# Patient Record
Sex: Female | Born: 1960 | Race: White | Hispanic: No | Smoking: Current every day smoker
Health system: Southern US, Community
[De-identification: ages and names within clinical notes are randomized; demographics above are authoritative.]

## PROBLEM LIST (undated history)

## (undated) DIAGNOSIS — R569 Unspecified convulsions: Secondary | ICD-10-CM

## (undated) DIAGNOSIS — I2510 Atherosclerotic heart disease of native coronary artery without angina pectoris: Secondary | ICD-10-CM

## (undated) DIAGNOSIS — J45909 Unspecified asthma, uncomplicated: Secondary | ICD-10-CM

## (undated) DIAGNOSIS — F329 Major depressive disorder, single episode, unspecified: Secondary | ICD-10-CM

## (undated) DIAGNOSIS — F419 Anxiety disorder, unspecified: Secondary | ICD-10-CM

## (undated) DIAGNOSIS — I639 Cerebral infarction, unspecified: Secondary | ICD-10-CM

## (undated) DIAGNOSIS — E119 Type 2 diabetes mellitus without complications: Secondary | ICD-10-CM

## (undated) DIAGNOSIS — F32A Depression, unspecified: Secondary | ICD-10-CM

## (undated) DIAGNOSIS — I10 Essential (primary) hypertension: Secondary | ICD-10-CM

---

## 2015-11-07 ENCOUNTER — Inpatient Hospital Stay (HOSPITAL_COMMUNITY)
Admission: AD | Admit: 2015-11-07 | Discharge: 2015-11-23 | DRG: 070 | Disposition: A | Discharge status: Nursing Home (Includes ICF) | Payer: Self-pay | Source: Other Acute Inpatient Hospital | Attending: Neurosurgery | Admitting: Neurosurgery

## 2015-11-07 DIAGNOSIS — G40909 Epilepsy, unspecified, not intractable, without status epilepticus: Secondary | ICD-10-CM | POA: Diagnosis present

## 2015-11-07 DIAGNOSIS — I2510 Atherosclerotic heart disease of native coronary artery without angina pectoris: Secondary | ICD-10-CM | POA: Diagnosis present

## 2015-11-07 DIAGNOSIS — J449 Chronic obstructive pulmonary disease, unspecified: Secondary | ICD-10-CM | POA: Diagnosis present

## 2015-11-07 DIAGNOSIS — F419 Anxiety disorder, unspecified: Secondary | ICD-10-CM | POA: Diagnosis present

## 2015-11-07 DIAGNOSIS — Z955 Presence of coronary angioplasty implant and graft: Secondary | ICD-10-CM

## 2015-11-07 DIAGNOSIS — E785 Hyperlipidemia, unspecified: Secondary | ICD-10-CM | POA: Diagnosis present

## 2015-11-07 DIAGNOSIS — E872 Acidosis: Secondary | ICD-10-CM | POA: Diagnosis present

## 2015-11-07 DIAGNOSIS — J441 Chronic obstructive pulmonary disease with (acute) exacerbation: Secondary | ICD-10-CM | POA: Insufficient documentation

## 2015-11-07 DIAGNOSIS — E876 Hypokalemia: Secondary | ICD-10-CM | POA: Diagnosis present

## 2015-11-07 DIAGNOSIS — I609 Nontraumatic subarachnoid hemorrhage, unspecified: Secondary | ICD-10-CM | POA: Diagnosis present

## 2015-11-07 DIAGNOSIS — I6783 Posterior reversible encephalopathy syndrome: Principal | ICD-10-CM | POA: Diagnosis present

## 2015-11-07 DIAGNOSIS — W57XXXA Bitten or stung by nonvenomous insect and other nonvenomous arthropods, initial encounter: Secondary | ICD-10-CM | POA: Diagnosis present

## 2015-11-07 DIAGNOSIS — I10 Essential (primary) hypertension: Secondary | ICD-10-CM | POA: Diagnosis present

## 2015-11-07 LAB — POCT I-STAT 3, ART BLOOD GAS (G3+)
ACID-BASE EXCESS: 7 mmol/L — AB (ref 0.0–2.0)
Bicarbonate: 31.1 mEq/L — ABNORMAL HIGH (ref 20.0–24.0)
O2 SAT: 97 %
PH ART: 7.486 — AB (ref 7.350–7.450)
PO2 ART: 86 mmHg (ref 80.0–100.0)
TCO2: 32 mmol/L (ref 0–100)
pCO2 arterial: 41.1 mmHg (ref 35.0–45.0)

## 2015-11-07 NOTE — H&P (Addendum)
Joan Newman is an 55 y.o. female.   Chief Complaint: unresponsiveness, seizure HPI: [name] was found unresponsive this morning by her mother. She had not answered the phone earlier in the day at 1000 and her mother then went to her home. [name] did not open the door when her mother arrived.The domicile was entered by law enforcement, and she was found awake, on the floor, and minimally responsive. She was brought to the ED at [facility] and noted to have 3 seizures while in the hospital. She was given ativan to control the seizures. Head ct showed a small amount of left frontal subarachnoid blood, not suggestive an aneurysmal hemorrhage but of PRES. She desaturated when she seized and placed on bipap, then weaned to a nasal cannula. Her family stated to medical personnel that she was withdrawing from benzodiazepines, though there was no stated reason she was taking benzodiazepines. EMS stated she had 3 days of nausea and emesis. Lactate found to be elevated at [facility], treated with a saline bolus. Initial exam in ED noted for no response, drooling post seizure  No past medical history on file.heart disease Seizure disorder Anxiety COPD Hyperlipidemia CAD   No past surgical history on file.  Cesarean section Coronary stent drug eluting placement  No family history on file. Social History:  has no tobacco, alcohol, and drug history on file.  Allergies: Allergies not on file  No prescriptions prior to admission    No results found for this or any previous visit (from the past 48 hour(s)). No results found.  Review of Systems  Unable to perform ROS: mental acuity    There were no vitals taken for this visit. Physical Exam  Constitutional: She appears well-developed and well-nourished. She appears lethargic.  HENT:  Head: Normocephalic and atraumatic.  Eyes: Conjunctivae are normal. Pupils are equal, round, and reactive to light.  Neck: Normal range of motion.   Neurological: She appears lethargic. She is disoriented.  Arouses to voice, and noxious stimuli. Not following commands. Unable to tell me her last name. Moving all extremities. Symmetric facial movements Motor exam not possible due to mental status. Localizes, purposeful. Cannot do detailed sensory exam Gait, coordination not assessed  Skin: Skin is warm and dry.     Assessment/Plan Will consult CCM No active neurosurgical issues, but clearly has neurological problems. Will consult Neurology.  Will continue Keppra, hydrate. Check ABG, and will check labs.  Will repeat head CT tomorrow  Jenyfer Trawick L, MD 11/07/2015, 11:32 PM

## 2015-11-08 ENCOUNTER — Inpatient Hospital Stay (HOSPITAL_COMMUNITY): Payer: Self-pay

## 2015-11-08 DIAGNOSIS — J449 Chronic obstructive pulmonary disease, unspecified: Secondary | ICD-10-CM

## 2015-11-08 DIAGNOSIS — J441 Chronic obstructive pulmonary disease with (acute) exacerbation: Secondary | ICD-10-CM | POA: Insufficient documentation

## 2015-11-08 DIAGNOSIS — T148 Other injury of unspecified body region: Secondary | ICD-10-CM

## 2015-11-08 DIAGNOSIS — I6783 Posterior reversible encephalopathy syndrome: Principal | ICD-10-CM

## 2015-11-08 DIAGNOSIS — W57XXXA Bitten or stung by nonvenomous insect and other nonvenomous arthropods, initial encounter: Secondary | ICD-10-CM

## 2015-11-08 DIAGNOSIS — I10 Essential (primary) hypertension: Secondary | ICD-10-CM | POA: Insufficient documentation

## 2015-11-08 LAB — COMPREHENSIVE METABOLIC PANEL
ALBUMIN: 3.1 g/dL — AB (ref 3.5–5.0)
ALK PHOS: 71 U/L (ref 38–126)
ALT: 20 U/L (ref 14–54)
ANION GAP: 5 (ref 5–15)
AST: 19 U/L (ref 15–41)
BILIRUBIN TOTAL: 0.9 mg/dL (ref 0.3–1.2)
BUN: <5 mg/dL — ABNORMAL LOW (ref 6–20)
CALCIUM: 7.5 mg/dL — AB (ref 8.9–10.3)
CO2: 30 mmol/L (ref 22–32)
Chloride: 103 mmol/L (ref 101–111)
Creatinine, Ser: 0.54 mg/dL (ref 0.44–1.00)
GFR calc Af Amer: >60 mL/min (ref >60)
GLUCOSE: 182 mg/dL — AB (ref 65–99)
Potassium: 3 mmol/L — ABNORMAL LOW (ref 3.5–5.1)
Sodium: 138 mmol/L (ref 135–145)
TOTAL PROTEIN: 6.2 g/dL — AB (ref 6.5–8.1)

## 2015-11-08 LAB — CBC WITH DIFFERENTIAL/PLATELET
BASOS ABS: 0 10*3/uL (ref 0.0–0.1)
Basophils Relative: 0 %
EOS ABS: 0 10*3/uL (ref 0.0–0.7)
EOS PCT: 0 %
HCT: 42.6 % (ref 36.0–46.0)
Hemoglobin: 14.1 g/dL (ref 12.0–15.0)
Lymphocytes Relative: 16 %
Lymphs Abs: 1.6 10*3/uL (ref 0.7–4.0)
MCH: 32.7 pg (ref 26.0–34.0)
MCHC: 33.1 g/dL (ref 30.0–36.0)
MCV: 98.8 fL (ref 78.0–100.0)
Monocytes Absolute: 0.6 10*3/uL (ref 0.1–1.0)
Monocytes Relative: 6 %
NEUTROS PCT: 78 %
Neutro Abs: 8.3 10*3/uL — ABNORMAL HIGH (ref 1.7–7.7)
PLATELETS: 188 10*3/uL (ref 150–400)
RBC: 4.31 MIL/uL (ref 3.87–5.11)
RDW: 14.6 % (ref 11.5–15.5)
WBC: 10.6 10*3/uL — AB (ref 4.0–10.5)

## 2015-11-08 LAB — APTT: APTT: 23 s — AB (ref 24–37)

## 2015-11-08 LAB — PROTIME-INR
INR: 1.01 (ref 0.00–1.49)
PROTHROMBIN TIME: 13.5 s (ref 11.6–15.2)

## 2015-11-08 LAB — MAGNESIUM: MAGNESIUM: 1.6 mg/dL — AB (ref 1.7–2.4)

## 2015-11-08 LAB — PHOSPHORUS: Phosphorus: 2.5 mg/dL (ref 2.5–4.6)

## 2015-11-08 LAB — MRSA PCR SCREENING: MRSA by PCR: NEGATIVE

## 2015-11-08 MED ORDER — ACETAMINOPHEN 325 MG PO TABS
650.0000 mg | ORAL_TABLET | Freq: Four times a day (QID) | ORAL | Status: DC | PRN
  Administered 2015-11-08 – 2015-11-21 (×7): 650 mg via ORAL
  Filled 2015-11-08 (×7): qty 2

## 2015-11-08 MED ORDER — SODIUM CHLORIDE 0.9% FLUSH: 3.0000 mL | INTRAVENOUS | Status: DC | PRN

## 2015-11-08 MED ORDER — OXYCODONE HCL 5 MG PO TABS
5.0000 mg | ORAL_TABLET | ORAL | Status: DC | PRN
  Administered 2015-11-08 – 2015-11-23 (×63): 5 mg via ORAL
  Filled 2015-11-08 (×64): qty 1

## 2015-11-08 MED ORDER — POTASSIUM CHLORIDE IN NACL 20-0.9 MEQ/L-% IV SOLN
INTRAVENOUS | Status: DC
  Administered 2015-11-08: 01:00:00 via INTRAVENOUS
  Filled 2015-11-08 (×3): qty 1000

## 2015-11-08 MED ORDER — ONDANSETRON HCL 4 MG PO TABS
4.0000 mg | ORAL_TABLET | Freq: Four times a day (QID) | ORAL | Status: DC | PRN
  Administered 2015-11-09 – 2015-11-13 (×2): 4 mg via ORAL
  Filled 2015-11-08 (×2): qty 1

## 2015-11-08 MED ORDER — POTASSIUM CHLORIDE CRYS ER 20 MEQ PO TBCR
40.0000 meq | EXTENDED_RELEASE_TABLET | Freq: Once | ORAL | Status: AC
  Administered 2015-11-08: 40 meq via ORAL
  Filled 2015-11-08: qty 2

## 2015-11-08 MED ORDER — SODIUM CHLORIDE 0.9 % IV SOLN
INTRAVENOUS | Status: DC
  Administered 2015-11-08 – 2015-11-09 (×3): via INTRAVENOUS

## 2015-11-08 MED ORDER — MOMETASONE FURO-FORMOTEROL FUM 200-5 MCG/ACT IN AERO
2.0000 | INHALATION_SPRAY | Freq: Two times a day (BID) | RESPIRATORY_TRACT | Status: DC
  Administered 2015-11-08 – 2015-11-22 (×26): 2 via RESPIRATORY_TRACT
  Filled 2015-11-08 (×2): qty 8.8

## 2015-11-08 MED ORDER — ONDANSETRON HCL 4 MG/2ML IJ SOLN
4.0000 mg | Freq: Four times a day (QID) | INTRAMUSCULAR | Status: DC | PRN
Start: 2015-11-07 — End: 2015-11-23
  Administered 2015-11-10: 4 mg via INTRAVENOUS
  Filled 2015-11-08: qty 2

## 2015-11-08 MED ORDER — IPRATROPIUM-ALBUTEROL 0.5-2.5 (3) MG/3ML IN SOLN: 3.0000 mL | RESPIRATORY_TRACT | Status: DC | PRN

## 2015-11-08 MED ORDER — HYDRALAZINE HCL 20 MG/ML IJ SOLN
10.0000 mg | INTRAMUSCULAR | Status: DC | PRN
  Administered 2015-11-09 (×2): 10 mg via INTRAVENOUS
  Administered 2015-11-10: 20 mg via INTRAVENOUS
  Filled 2015-11-08 (×3): qty 1

## 2015-11-08 MED ORDER — SODIUM CHLORIDE 0.9% FLUSH
3.0000 mL | Freq: Two times a day (BID) | INTRAVENOUS | Status: DC
  Administered 2015-11-08: 10 mL via INTRAVENOUS
  Administered 2015-11-08 – 2015-11-19 (×13): 3 mL via INTRAVENOUS

## 2015-11-08 MED ORDER — SODIUM CHLORIDE 0.9 % IV SOLN: 250.0000 mL | INTRAVENOUS | Status: DC | PRN

## 2015-11-08 MED ORDER — ACETAMINOPHEN 650 MG RE SUPP
650.0000 mg | Freq: Four times a day (QID) | RECTAL | Status: DC | PRN
Start: 2015-11-07 — End: 2015-11-23

## 2015-11-08 MED ORDER — SODIUM CHLORIDE 0.9 % IV SOLN
500.0000 mg | Freq: Two times a day (BID) | INTRAVENOUS | Status: DC
  Administered 2015-11-08 – 2015-11-09 (×4): 500 mg via INTRAVENOUS
  Filled 2015-11-08 (×5): qty 5

## 2015-11-08 NOTE — Consult Note (Signed)
NEURO HOSPITALIST CONSULT NOTE      Reason for Consult: PRES?   History obtained from:  Patient and Chart    HPI:                                                                                                                                          Joan Newman is an 55 yo female with hx of HTN and anxiety was found unresponsive by her mother yesterday. She was brought to the ER at [facility] and was noted to have 3 GTC seizures. CTH showed some subarachnoid blood suggestive of PRES. She was then transferred to [facility]. She apparently has been having emesis for the past three days and has not taken her BP and anxiety medications which could resulted in elevation of BP and benzo withdrawal to lead to PRES and seizure activity. No past medical history on file.  No past surgical history on file.  No family history on file.    Social History:  has no tobacco, alcohol, and drug history on file.  Not on File  MEDICATIONS:                                                                                                                     I have reviewed the patient's current medications.   ROS:                                                                                                                                       History obtained from chart review and the patient  General ROS: negative for - chills, fatigue, fever, night sweats, weight gain or weight loss Psychological ROS: negative for -  behavioral disorder, hallucinations, memory difficulties, mood swings or suicidal ideation Ophthalmic ROS: negative for - blurry vision, double vision, eye pain or loss of vision ENT ROS: negative for - epistaxis, nasal discharge, oral lesions, sore throat, tinnitus or vertigo Allergy and Immunology ROS: negative for - hives or itchy/watery eyes Hematological and Lymphatic ROS: negative for - bleeding problems, bruising or swollen lymph nodes Endocrine  ROS: negative for - galactorrhea, hair pattern changes, polydipsia/polyuria or temperature intolerance Respiratory ROS: negative for - cough, hemoptysis, shortness of breath or wheezing Cardiovascular ROS: negative for - chest pain, dyspnea on exertion, edema or irregular heartbeat Gastrointestinal ROS: negative for - abdominal pain, diarrhea, hematemesis, nausea/vomiting or stool incontinence Genito-Urinary ROS: negative for - dysuria, hematuria, incontinence or urinary frequency/urgency Musculoskeletal ROS: negative for - joint swelling or muscular weakness Neurological ROS: as noted in HPI Dermatological ROS: negative for rash and skin lesion changes   Blood pressure 141/70, pulse 88, temperature 99.5 F (37.5 C), temperature source Oral, resp. rate 18, height 5\' 3"  (1.6 m), weight 75.7 kg (166 lb 14.2 oz), SpO2 96 %.   Neurologic Examination:                                                                                                      HEENT-  Normocephalic, no lesions, without obvious abnormality.  Normal external eye and conjunctiva.  Normal TM's bilaterally.  Normal auditory canals and external ears. Normal external nose, mucus membranes and septum.  Normal pharynx. Cardiovascular- regular rate and rhythm, S1, S2 normal, no murmur, click, rub or gallop, pulses palpable throughout   Lungs- chest clear, no wheezing, rales, normal symmetric air entry, Heart exam - S1, S2 normal, no murmur, no gallop, rate regular Abdomen- soft, non-tender; bowel sounds normal; no masses,  no organomegaly   Neurological Examination Mental Status: Alert, oriented, thought content appropriate.  Speech fluent without evidence of aphasia.  Able to follow 2 step commands without difficulty. Cranial Nerves: II: Visual fields grossly normal, pupils equal, round, reactive to light  III,IV, VI: ptosis not present, extra-ocular motions intact bilaterally V,VII: smile symmetric, facial light touch sensation  normal bilaterally VIII: hearing normal bilaterally IX,X: uvula rises symmetrically XI: bilateral shoulder shrug XII: midline tongue extension Motor: Right : Upper extremity   5/5    Left:     Upper extremity   4/5  Lower extremity   5/5     Lower extremity   4/5 Tone and bulk:normal tone throughout; no atrophy noted Sensory: Pinprick and light touch intact throughout, bilaterally       Lab Results: Basic Metabolic Panel:  Recent Labs Lab 11/08/15 0102  NA 138  K 3.0*  CL 103  CO2 30  GLUCOSE 182*  BUN <5*  CREATININE 0.54  CALCIUM 7.5*  MG 1.6*  PHOS 2.5    Liver Function Tests:  Recent Labs Lab 11/08/15 0102  AST 19  ALT 20  ALKPHOS 71  BILITOT 0.9  PROT 6.2*  ALBUMIN 3.1*   No results for input(s): LIPASE, AMYLASE in the last 168 hours. No results for  input(s): AMMONIA in the last 168 hours.  CBC:  Recent Labs Lab 11/08/15 0102  WBC 10.6*  NEUTROABS 8.3*  HGB 14.1  HCT 42.6  MCV 98.8  PLT 188    Cardiac Enzymes: No results for input(s): CKTOTAL, CKMB, CKMBINDEX, TROPONINI in the last 168 hours.  Lipid Panel: No results for input(s): CHOL, TRIG, HDL, CHOLHDL, VLDL, LDLCALC in the last 168 hours.  CBG: No results for input(s): GLUCAP in the last 168 hours.  Microbiology: Results for orders placed or performed during the hospital encounter of 11/07/15  MRSA PCR Screening     Status: None   Collection Time: 11/07/15 11:01 PM  Result Value Ref Range Status   MRSA by PCR NEGATIVE NEGATIVE Final    Comment:        The GeneXpert MRSA Assay (FDA approved for NASAL specimens only), is one component of a comprehensive MRSA colonization surveillance program. It is not intended to diagnose MRSA infection nor to guide or monitor treatment for MRSA infections.     Coagulation Studies:  Recent Labs  11/08/15 0102  LABPROT 13.5  INR 1.01    Imaging: No results found.      Assessment/Plan:  55 yo female with hx of HTN and  anxiety was found unresponsive by her mother yesterday. She was brought to the ER at [facility] and was noted to have 3 GTC seizures. CTH showed some subarachnoid blood suggestive of PRES. She was then [facility]. She apparently has been having emesis for the past three days and has not taken her BP and anxiety medications which could resulted in elevation of BP and benzo withdrawal to lead to PRES and seizure activity.  - Currently awake and alert, oriented and following commands - SBP < 140 - MRI brain with and without contrast - Continue Keppra  BID

## 2015-11-08 NOTE — Consult Note (Signed)
PULMONARY / CRITICAL CARE MEDICINE   Name: Joan Newman MRN: [id number] DOB: 08-Dec-1960    ADMISSION DATE:  11/07/2015 CONSULTATION DATE:  11/08/2015  REFERRING MD:  Dr. Franky Macho  CHIEF COMPLAINT:  AMS  HISTORY OF PRESENT ILLNESS:   55 year old female with PMH which includes HTN, HLD, COPD, and Anxiety. Records from OSH also report PMH including MI, CAD s/p DES to LAD. She presented to [facility] ED 7/12 with complaints of altered mental status where she was found down. Per mother, she has been having issues with her mental health since losing her husband to suicide back in 2011. Her mother is worried that she does not take her medications (Klonipin) as it is prescribed. She lives alone and was last seen normal one day prior to presentation (7/11). 7/21 Mother tried to contact daughter, but was unable to. She also tried to go to her house but was unable to get in because the patient was laying on floor on the other side of door minimally responsive. She presented to ED via EMS and was talking but disoriented. Shortly after entering ED she had a witnessed seizure (total of 3), which were abated with  ativan. There was some concern for benzodiazepine withdrawal, but patient reports taking all medications regularly and not having stopped. She does not recall seizing at home or any particular aura. Of note she did have a tick bite several weeks ago. She reports having this worked up as an outpatient, however, she does not recall the results.   She underwent CT scan at [facility] which demonstrated L frontal SAH and changes consistent with PRES. She was transported to [facility] for neurosurgical evaluation. She was seen by Dr. Franky Macho who does not feel that there is any role for neurosurgical evaluation. PCCM consulted for COPD.   PAST MEDICAL HISTORY :  She  has no past medical history on file.  PAST SURGICAL HISTORY: She  has no past surgical history on file.  Not on File  No current  facility-administered medications on file prior to encounter.   No current outpatient prescriptions on file prior to encounter.    FAMILY HISTORY:  Her has no family status information on file.   SOCIAL HISTORY: She    REVIEW OF SYSTEMS:   Review of Systems:   Bolds are positive  Constitutional: weight loss, gain, night sweats, Fevers, chills, fatigue .  HEENT: headaches, Sore throat, sneezing, nasal congestion, post nasal drip, Difficulty swallowing, Tooth/dental problems, visual complaints visual changes, ear ache CV:  chest pain, radiates: ,Orthopnea, PND, swelling in lower extremities, dizziness, palpitations, syncope.  GI  heartburn, indigestion, abdominal pain, nausea, vomiting, diarrhea, change in bowel habits, loss of appetite, bloody stools.  Resp: cough, productive: , hemoptysis, dyspnea, chest pain, pleuritic.  Skin: rash or itching or icterus GU: dysuria, change in color of urine, urgency or frequency. flank pain, hematuria  MS: joint pain or swelling. decreased range of motion  Psych: change in mood or affect. depression or anxiety.  Neuro: difficulty with speech, weakness, numbness, ataxia    SUBJECTIVE:    VITAL SIGNS: BP 141/70 mmHg  Pulse 88  Temp(Src) 99.5 F (37.5 C) (Oral)  Resp 18  Ht  (1.6 m)  Wt 75.7 kg (166 lb 14.2 oz)  BMI 29.57 kg/m2  SpO2 96%  HEMODYNAMICS:    VENTILATOR SETTINGS:    INTAKE / OUTPUT: I/O last 3 completed shifts: In: 721.7 [I.V.:616.7; IV Piggyback:105] Out: 755 [Urine:755]  PHYSICAL EXAMINATION: General:  Overweight female in NAD Neuro:  Alert, oriented, non-focal, somnolent HEENT:  [place]/AT, PERRL, no JVD Cardiovascular:  RRR, no MRG Lungs:  Clear bilateral breath sounds Abdomen:  Soft, non-tender, non-distended Musculoskeletal:  No acute deformity or ROM limitation Skin:  Grossly intact  LABS:  BMET  Recent Labs Lab 11/08/15 0102  NA 138  K 3.0*  CL 103  CO2 30  BUN <5*  CREATININE 0.54  GLUCOSE  182*    Electrolytes  Recent Labs Lab 11/08/15 0102  CALCIUM 7.5*  MG 1.6*  PHOS 2.5    CBC  Recent Labs Lab 11/08/15 0102  WBC 10.6*  HGB 14.1  HCT 42.6  PLT 188    Coag's  Recent Labs Lab 11/08/15 0102  APTT 23*  INR 1.01    Sepsis Markers No results for input(s): LATICACIDVEN, PROCALCITON, O2SATVEN in the last 168 hours.  ABG  Recent Labs Lab 11/07/15 2353  PHART 7.486*  PCO2ART 41.1  PO2ART 86.0    Liver Enzymes  Recent Labs Lab 11/08/15 0102  AST 19  ALT 20  ALKPHOS 71  BILITOT 0.9  ALBUMIN 3.1*    Cardiac Enzymes No results for input(s): TROPONINI, PROBNP in the last 168 hours.  Glucose No results for input(s): GLUCAP in the last 168 hours.  Imaging No results found.   STUDIES:  CT head 7/13 > small amount of left frontal subarachnoid blood, not suggestive an aneurysmal hemorrhage but of PRES.  CULTURES: Lyme 7/13 > RMSF 7/13 >   ANTIBIOTICS:   SIGNIFICANT EVENTS:   LINES/TUBES:   DISCUSSION: 55 year old female found on floor at home with AMS. 3 seizures in ED. Some concern for benzo withdrawal, however, recent history of tick bite. Also PRES on CT scan. Neurosurgery and neurology following. History of COPD without current acute exacerbation.   ASSESSMENT / PLAN:  SAH PRES Seizures - Per primary, neurology  Chronic benzodiazepine use with potential withdrawal - Hold scheduled benzos, AEDs per primary  COPD without acute exacerbation -Supplemental O2 to keeps sats >90% -Resume home LABA / ICS > to [facility] for formulary substitution for Advair -PRN nebs -No role sterods -Incentive spirometry  HTN HLD - BP parameters per primary in setting of PRES  Lactic acidosis > secondary to seizures - no value in trending lactic  Hypokalemia -Replete K  Tick Bite - Send Lyme DNA by PCR - Send RMSF  Joneen RoachPaul Hoffman, AGACNP-BC [facility] Pager [phone] or [phone]  11/08/2015  10:59 AM    Attending Note:  I have examined patient, reviewed labs, studies and notes. I have discussed the case with Henreitta LeberP Hoffman, and I agree with the data and plans as amended above. 55 yo woman with HTN, COPD and anxiety d/o. Notes some nausea/ vomiting and poor PO prior to this illness. Was found encephalopathic at home, suspected that she had not been able to take her home meds although she denies missing any. She was [facility] at [facility], where she was confused, hypertensive and had witnessed seizures. Head CT with some subarachnoid blood and changes consistent with PRES. Being monitored in ICU for stability of her SAH. Interestingly she had a tick bite several weeks ago - clinical relevance to current presentation unclear. She is lethargic on exam, but no evidence agitation or seizure activity. No rashes. No wheeze to suggest COPD exacerbation. Agree with ICU monitoring as long as deemed necessary by NSGY, tight BP control. Keppra per Neurology plans. We will restart her scheduled BD, follow for  any evidence benzo withdrawal. No evidence of this currently. Will attempt to find her labwork regarding the tick bite for completeness. Also check RMSG and lyme labs here.   Levy Pupa, MD, PhD 11/08/2015, 11:37 AM [facility] [phone] or if no answer [phone]

## 2015-11-08 NOTE — Progress Notes (Addendum)
Patient ID: Joan Newman, female   DOB: 1960-09-13, 55 y.o.   MRN: [id number] BP 155/77 mmHg  Pulse 87  Temp(Src) 98.9 F (37.2 C) (Oral)  Resp 17  Ht 5\' 3"  (1.6 m)  Wt 75.7 kg (166 lb 14.2 oz)  BMI 29.57 kg/m2  SpO2 95% Lethargic, easily arouses to voice.  Will follow commands Oriented to person, not to place Moving all extremities.  I had a chance to speak with her daughter this afternoon. Ms. Joan Newman has a known seizure disorder, but was not taking her seizure medication. She would take other medications but not the antiepileptic.  Appreciate neurology and ccm assistance.  Head CT is unchanged. MRI is pending.  Did have CT head ordered in June of this year for altered mental status. Her daughter stated she does live by herself.

## 2015-11-08 NOTE — Progress Notes (Signed)
eLink Physician-Brief Progress Note Patient Name: Tresea Malleresa Mandrell DOB: March 25, 1961 MRN: [id number]   Date of Service  11/08/2015  HPI/Events of Note  Notified by bedside nurse of nonurgent consultation for COPD. Per documentation patient transferred from our facility after being found at approximately 10 AM down at home. Reportedly patient had 3 seizures while in hospital at outside facility. Imaging reportedly showed left frontal subarachnoid blood consistent with PRES per Neurosurgery. Reportedly patient has underlying COPD but records are limited. ABG on 3 L/m shows pH 7.49/PCO2 41/PO2 86/saturation 97%. Camera check shows patient sleeping soundly in bed on her left side. Currently on nasal cannula with stable vital signs and no respiratory distress.   eICU Interventions  1. Ordering when necessary Duonebs for wheezing, shortness of breath, or cough 2. Formal consultation by PCCM in AM or sooner if need dictates     Intervention Category Evaluation Type: New Patient Evaluation  Lawanda CousinsJennings Remi Rester 11/08/2015, 12:35 AM

## 2015-11-08 NOTE — Progress Notes (Signed)
Inpatient Diabetes Program Recommendations  AACE/ADA: New Consensus Statement on Inpatient Glycemic Control (2015)  Target Ranges:  Prepandial:   less than 140 mg/dL      Peak postprandial:   less than 180 mg/dL (1-2 hours)      Critically ill patients:  140 - 180 mg/dL   Results for Joan Newman, Joan Newman (MRN [id number]) as of 11/08/2015 09:24  Ref. Range 11/08/2015 01:02  Glucose Latest Ref Range: 65-99 mg/dL 409182 (H)   Review of Glycemic Control  Diabetes history: NO Outpatient Diabetes medications: NA Current orders for Inpatient glycemic control: None  Inpatient Diabetes Program Recommendations: Correction (SSI): Noted lab glucose of 182 mg/dl at 8:111:02 am on 9/14/787/13/17. May want to consider ordering CBGs and Novolog correction if needed. HgbA1C: Please consider ordering an A1C to evaluate glycemic control over the past 2-3 months.  Thanks, Orlando PennerMarie Maui Britten, RN, MSN, CDE Diabetes Coordinator Inpatient Diabetes Program [phone] (Team Pager from 8am to 5pm) [phone] ([facility] office) [phone] [facility] office) [phone] [facility] office)

## 2015-11-09 LAB — ROCKY MTN SPOTTED FVR ABS PNL(IGG+IGM)
RMSF IGM: 0.4 {index} (ref 0.00–0.89)
RMSF IgG: NEGATIVE

## 2015-11-09 LAB — URINE CULTURE: Culture: NO GROWTH

## 2015-11-09 LAB — B. BURGDORFI ANTIBODIES

## 2015-11-09 MED ORDER — SODIUM CHLORIDE 0.9 % IV SOLN
INTRAVENOUS | Status: DC
  Administered 2015-11-09: 22:00:00 via INTRAVENOUS

## 2015-11-09 MED ORDER — LEVETIRACETAM 500 MG PO TABS: 500.0000 mg | ORAL_TABLET | Freq: Two times a day (BID) | ORAL | Status: DC

## 2015-11-09 MED ORDER — LEVETIRACETAM 500 MG PO TABS
500.0000 mg | ORAL_TABLET | Freq: Two times a day (BID) | ORAL | Status: DC
  Administered 2015-11-09 – 2015-11-23 (×28): 500 mg via ORAL
  Filled 2015-11-09 (×28): qty 1

## 2015-11-09 NOTE — Discharge Instructions (Addendum)
Call your primary care physician if you have any mental status changes. You must take your seizure medication.

## 2015-11-09 NOTE — Evaluation (Signed)
Occupational Therapy Evaluation Patient Details Name: Joan Newman MRN: [id number] DOB: 1961/01/01 Today's Date: 11/09/2015    History of Present Illness Pt admitted on 11/07/15 with seizure activity and SAH. PMH which includes HTN, HLD, COPD, and Anxiety. Records from OSH also report PMH including MI, CAD s/p DES to LAD   Clinical Impression   Pt reports she was independent with ADLs and mobility PTA. Currently pt overall min guard for ADLs and functional mobility. Pt presenting with visual deficits and cognitive impairments impacting her independence and safety with ADLs and functional mobility at this time. Pt noted to have decreased short term memory in addition to difficulty sequencing and initiating unfamiliar tasks despite max verbal, tactile, and visual cues. Recommending SNF for follow up in order to maximize independence and safety with ADLs and mobility prior to return home alone. Pt would benefit from continued skilled OT to address established goals.    Follow Up Recommendations  SNF;Supervision/Assistance - 24 hour (If pt has 24/7 supervision at home; [facility]    Equipment Recommendations  Other (comment) (TBD)    Recommendations for Other Services Speech consult     Precautions / Restrictions Precautions Precautions: Fall Restrictions Weight Bearing Restrictions: No      Mobility Bed Mobility Overal bed mobility: Needs Assistance Bed Mobility: Rolling;Supine to Sit Rolling: Supervision   Supine to sit: Supervision     General bed mobility comments: supervision for safety and line management, no physical assist required  Transfers Overall transfer level: Needs assistance Equipment used: 1 person hand held assist Transfers: Sit to/from Stand Sit to Stand: Min guard         General transfer comment: min guard for stability upon coming to standing    Balance Overall balance assessment: Needs assistance Sitting-balance support: Feet  supported;No upper extremity supported Sitting balance-Leahy Scale: Fair     Standing balance support: No upper extremity supported;During functional activity Standing balance-Leahy Scale: Fair Standing balance comment: able to perform dynamic static taks with ming guard. with eyes closed, patient with posterior LOB             High level balance activites: Direction changes;Turns;Head turns High Level Balance Comments: min assist with higher level balance tasks            ADL Overall ADL's : Needs assistance/impaired     Grooming: Min guard;Standing   Upper Body Bathing: Set up;Supervision/ safety;Sitting   Lower Body Bathing: Min guard;Sit to/from stand   Upper Body Dressing : Set up;Supervision/safety;Sitting   Lower Body Dressing: Min guard;Sit to/from stand Lower Body Dressing Details (indicate cue type and reason): Pt able to don socks sitting EOB Toilet Transfer: Min guard;Ambulation;Regular Social workerToilet   Toileting- Clothing Manipulation and Hygiene: Min guard;Sit to/from stand Toileting - Clothing Manipulation Details (indicate cue type and reason): Pt able to perform peri care in standing     Functional mobility during ADLs: Min guard General ADL Comments: Pt able to complete functional activities well. Pt with poor memory and difficulty following commands with unfamiliar tasks.      Vision Vision Assessment?: Vision impaired- to be further tested in functional context Additional Comments: Difficult to assess due to impaired cognition. Attempted formal visual testing but pt with difficulty following commands. Pt unable to read large print font on therapists name tag.   Perception     Praxis      Pertinent Vitals/Pain Pain Assessment: No/denies pain     Hand Dominance Right   Extremity/Trunk Assessment Upper  Extremity Assessment Upper Extremity Assessment: Overall WFL for tasks assessed   Lower Extremity Assessment Lower Extremity Assessment: Defer to  PT evaluation   Cervical / Trunk Assessment Cervical / Trunk Assessment: Normal   Communication Communication Communication: No difficulties   Cognition Arousal/Alertness: Awake/alert Behavior During Therapy: WFL for tasks assessed/performed Overall Cognitive Status: Impaired/Different from baseline Area of Impairment: Attention;Memory;Following commands;Safety/judgement;Awareness;Problem solving   Current Attention Level: Sustained Memory: Decreased short-term memory Following Commands: Follows one step commands consistently;Follows one step commands with increased time;Follows multi-step commands inconsistently   Awareness: Emergent Problem Solving: Slow processing;Requires verbal cues;Requires tactile cues General Comments: patient required max multimodal cues to perform proprioceptive assessment activities, increased time to perform with repetitive cues for task performance. Unable to recall of contextual conversation after 3 minutes   General Comments       Exercises       Shoulder Instructions      Home Living Family/patient expects to be discharged to:: Private residence Living Arrangements: Alone   Type of Home: Mobile home Home Access: Stairs to enter Entrance Stairs-Number of Steps: 4 Entrance Stairs-Rails: None Home Layout: One level     Bathroom Shower/Tub: Chief Strategy Officer: Standard     Home Equipment: None          Prior Functioning/Environment Level of Independence: Independent             OT Diagnosis: Cognitive deficits;Disturbance of vision;Altered mental status   OT Problem List: Impaired balance (sitting and/or standing);Impaired vision/perception;Decreased coordination;Decreased cognition;Decreased safety awareness   OT Treatment/Interventions: Self-care/ADL training;Energy conservation;DME and/or AE instruction;Therapeutic activities;Cognitive remediation/compensation;Visual/perceptual  remediation/compensation;Patient/family education;Balance training    OT Goals(Current goals can be found in the care plan section) Acute Rehab OT Goals Patient Stated Goal: to go home OT Goal Formulation: With patient Time For Goal Achievement: 11/23/15 Potential to Achieve Goals: Good ADL Goals Pt Will Perform Grooming: with modified independence;standing Pt Will Perform Upper Body Bathing: with modified independence;sitting Pt Will Perform Lower Body Bathing: with modified independence;sit to/from stand Pt Will Transfer to Toilet: with modified independence;ambulating;regular height toilet Pt Will Perform Toileting - Clothing Manipulation and hygiene: with modified independence;sit to/from stand Additional ADL Goal #1: Pt will complete unfamiliar task with min verbal cues for sequencing and initiation.   OT Frequency: Min 2X/week   Barriers to D/C: Decreased caregiver support  pt lives alone       Co-evaluation              End of Session Equipment Utilized During Treatment: Gait belt Nurse Communication: Mobility status;Other (comment) (SLP cognitive consult)  Activity Tolerance: Patient tolerated treatment well Patient left: in chair;with call bell/phone within reach;with chair alarm set   Time: [phone] OT Time Calculation (min): 21 min Charges:  OT General Charges $OT Visit: 1 Procedure OT Evaluation $OT Eval Moderate Complexity: 1 Procedure G-Codes:     Gaye Alken M.S., OTR/L Pager: [phone]  11/09/2015, 5:36 PM

## 2015-11-09 NOTE — Care Management Note (Signed)
Case Management Note  Patient Details  Name: Joan Newman MRN: [id number] Date of Birth: 09/01/1960  Subjective/Objective:     Pt admitted on 11/07/15 with seizure activity and SAH.  PTA, pt independent, lives at home alone.  Per report, pt's mother and brother are concerned about pt living alone, as she abuses cigarettes and Rx drugs.                 Action/Plan: Met with pt to discuss dc planning.  Pt A&O x 3; states lives alone, but has support from mother.  She does not drive due to seizure disorder.  Pt has PCP in [facility], Dr. Cyndi Bender, and gets Rx meds for free at a pharmacy in [facility].  Pt denies needs for home; states has transportation to physician's appointments.   Currently, it appears pt able to make her own decisions, and denies any problem with her home environment.  CM will sign off, as pt independent and able to make decisions for herself.    Expected Discharge Date:                  Expected Discharge Plan:  Home/Self Care  In-House Referral:     Discharge planning Services  CM Consult  Post Acute Care Choice:    Choice offered to:     DME Arranged:    DME Agency:     HH Arranged:    HH Agency:     Status of Service:  In process, will continue to follow  If discussed at Long Length of Stay Meetings, dates discussed:    Additional Comments:  Reinaldo Raddle, RN, BSN  Trauma/Neuro ICU Case Manager [phone]

## 2015-11-09 NOTE — Evaluation (Signed)
Physical Therapy Evaluation Patient Details Name: Joan Newman MRN: [id number] DOB: Oct 03, 1960 Today's Date: 11/09/2015   History of Present Illness  Pt admitted on 11/07/15 with seizure activity and SAH. PMH which includes HTN, HLD, COPD, and Anxiety. Records from OSH also report PMH including MI, CAD s/p DES to LAD    Clinical Impression  Patient demonstrates deficits in functional mobility as indicated below. Will need continued skilled PT to address deficits and maximize function. Will see as indicated and progress as tolerated. OF NOTE: patient with noted cognitive deficits impacting safety and function. Patient alert and oriented but required increased cues and assist for short term memory recall, patient had difficulty problem solving and multi tasking 2 step commands. Patient also demonstrates prompted responses to questions at times during session. At this time, do not feel patient is safe for d/c home alone, recommend ST SNF upon acute discharge unless family can ensure adequate 24/7 assist. Will follow.    Follow Up Recommendations SNF;Supervision/Assistance - 24 hour (if pt family can provide 24/7 may consider home with assist)    Equipment Recommendations  Other (comment) (tbd)    Recommendations for Other Services Speech consult (cognition)     Precautions / Restrictions Precautions Precautions: Fall Restrictions Weight Bearing Restrictions: No      Mobility  Bed Mobility Overal bed mobility: Needs Assistance Bed Mobility: Rolling;Supine to Sit Rolling: Supervision   Supine to sit: Supervision     General bed mobility comments: supervision for safety and line management, no physical assist required  Transfers Overall transfer level: Needs assistance Equipment used: 1 person hand held assist Transfers: Sit to/from Stand Sit to Stand: Min guard         General transfer comment: min guard for stability upon coming to  standing  Ambulation/Gait Ambulation/Gait assistance: Min guard;Min assist Ambulation Distance (Feet): 110 Feet Assistive device: 1 person hand held assist Gait Pattern/deviations: Step-through pattern;Decreased stride length;Staggering left;Drifts right/left;Narrow base of support Gait velocity: decreased   General Gait Details: 3 episodes of LOB requiring min assist to stabilize, instability noted throughout session. increased assist for multi-task ambulation and higher level balance  Stairs            Wheelchair Mobility    Modified Rankin (Stroke Patients Only) Modified Rankin (Stroke Patients Only) Pre-Morbid Rankin Score: No symptoms Modified Rankin: Moderately severe disability     Balance Overall balance assessment: Needs assistance Sitting-balance support: Feet supported Sitting balance-Leahy Scale: Fair       Standing balance-Leahy Scale: Fair Standing balance comment: able to perform dynamic static taks with ming guard. with eyes closed, patient with posterior LOB             High level balance activites: Direction changes;Turns;Head turns High Level Balance Comments: min assist with higher level balance tasks             Pertinent Vitals/Pain Pain Assessment: No/denies pain    Home Living Family/patient expects to be discharged to:: Private residence Living Arrangements: Alone   Type of Home: Mobile home Home Access: Stairs to enter Entrance Stairs-Rails: None Entrance Stairs-Number of Steps: 4 Home Layout: One level Home Equipment: None      Prior Function Level of Independence: Independent               Hand Dominance   Dominant Hand: Right    Extremity/Trunk Assessment   Upper Extremity Assessment: Overall WFL for tasks assessed           Lower Extremity  Assessment: Overall WFL for tasks assessed         Communication   Communication: No difficulties  Cognition Arousal/Alertness: Awake/alert Behavior During  Therapy: WFL for tasks assessed/performed Overall Cognitive Status: Impaired/Different from baseline Area of Impairment: Attention;Memory;Following commands;Safety/judgement;Awareness;Problem solving   Current Attention Level: Sustained Memory: Decreased short-term memory Following Commands: Follows one step commands consistently;Follows one step commands with increased time;Follows multi-step commands inconsistently   Awareness: Emergent Problem Solving: Slow processing;Requires verbal cues;Requires tactile cues General Comments: patient required max multimodal cues to perform proprioceptive assessment activities, increased time to perform with repetitive cues for task performance. Unable to recall of contextual conversation after 3 minutes    General Comments      Exercises        Assessment/Plan    PT Assessment Patient needs continued PT services  PT Diagnosis Difficulty walking;Altered mental status   PT Problem List Decreased activity tolerance;Decreased balance;Decreased mobility;Decreased coordination;Decreased cognition  PT Treatment Interventions DME instruction;Gait training;Stair training;Functional mobility training;Therapeutic activities;Therapeutic exercise;Balance training;Cognitive remediation;Patient/family education   PT Goals (Current goals can be found in the Care Plan section) Acute Rehab PT Goals Patient Stated Goal: to go home PT Goal Formulation: With patient Time For Goal Achievement: 11/23/15 Potential to Achieve Goals: Good    Frequency Min 3X/week   Barriers to discharge Decreased caregiver support      Co-evaluation               End of Session Equipment Utilized During Treatment: Gait belt Activity Tolerance: Patient tolerated treatment well Patient left: in chair;with call bell/phone within reach;with chair alarm set Nurse Communication: Mobility status         Time: 1610-9604 PT Time Calculation (min) (ACUTE ONLY): 21  min   Charges:   PT Evaluation $PT Eval Moderate Complexity: 1 Procedure     PT G CodesFabio Asa 11/14/2015, 5:15 PM Charlotte Crumb, PT DPT  [phone]

## 2015-11-09 NOTE — Progress Notes (Signed)
Patient transferred to 5C19 from 82M at this. Safety precautions reviewed with patient. Dr. Franky Machoabbell wrote order to be d/c if MRI is ok and neurology ok with result. This receiving RN have not seen neurology round on pt unless they have rounded on pt in the ICU. Will awaiting for discharge from neurology in the AM.  Sim BoastHavy, RN

## 2015-11-09 NOTE — Discharge Summary (Addendum)
  Physician Discharge Summary  Patient ID: Tresea Malleresa Laviolette MRN: [id number] DOB/AGE: September 28, 1960 55 y.o.  Admit date: 11/07/2015 Discharge date: 11/23/2015 Admission Diagnoses:PRES Seizure disorder  Discharge Diagnoses:  Active Problems:   PRES (posterior reversible encephalopathy syndrome)   Chronic obstructive pulmonary disease (HCC)   Essential hypertension   Tick bite   Discharged Condition: good  Hospital Course: [name] was admitted after being found down at her home. [facility] and noted to have a very small amount of subarachnoid blood on CT, she had multiple seizures, and remained lethargic. After transfer to [facility], repeat head CT showed no change. She improved and at discharge has a normal exam except for continued confusion. The radiologists still consider the imaging to be most consistent with PRES. Neurology saw the patient and concurred with the diagnosis. No follow up was recommended by neurology. She has had repeat head CT's which show the blood has resolved, and the brain has returned to a more normal appearance.   Treatments: none  Discharge Exam: Blood pressure 171/79, pulse 100, temperature 99.2 F (37.3 C), temperature source Oral, resp. rate 20, height 5\' 3"  (1.6 m), weight 75.7 kg (166 lb 14.2 oz), SpO2 95 %. Neurologic: Alert and oriented X 3, normal strength and tone. Normal symmetric reflexes. Normal coordination and gait  Disposition: Final discharge disposition not confirmed * No surgery found *    Medication List    TAKE these medications        levETIRAcetam 500 MG tablet  Commonly known as:  KEPPRA  Take 1 tablet (500 mg total) by mouth every 12 (twelve) hours.       ultram 50mg  1 tablet per os every 6 hours as needed for pain.  Signed: Letticia Bhattacharyya L 11/09/2015, 7:12 PM

## 2015-11-09 NOTE — Progress Notes (Signed)
   11/09/15 1140  Clinical Encounter Type  Visited With Family;Health care provider  Visit Type Initial;Social support;Critical Care  Referral From Family  Spiritual Encounters  Spiritual Needs Emotional  Stress Factors  Family Stress Factors Health changes   Chaplain met with patient's family (mother and brother) outside of patient's room. Patient's mom expressed concern about the patient being discharged to home. Stated that she cannot take care of herself and would end up back in the same medical situation. Chaplain passed this along to patient's RN, who reached out to social work.Chaplain introduced spiritual care services. Spiritual care services available as needed.  Jeri Lager, Chaplain 11/09/2015 11:46 AM

## 2015-11-09 NOTE — Progress Notes (Signed)
PULMONARY / CRITICAL CARE MEDICINE   Name: Joan Newman MRN: [id number] DOB: 1960/07/22    ADMISSION DATE:  11/07/2015 CONSULTATION DATE:  11/08/2015  REFERRING MD:  Dr. Franky Macho  CHIEF COMPLAINT:  AMS  HISTORY OF PRESENT ILLNESS:   55 year old female with PMH which includes HTN, HLD, COPD, and Anxiety. Records from OSH also report PMH including MI, CAD s/p DES to LAD. She presented to [facility] ED 7/12 with complaints of altered mental status where she was found down. Per mother, she has been having issues with her mental health since losing her husband to suicide back in 2011. Her mother is worried that she does not take her medications (Klonipin) as it is prescribed. She lives alone and was last seen normal one day prior to presentation (7/11). 7/21 Mother tried to contact daughter, but was unable to. She also tried to go to her house but was unable to get in because the patient was laying on floor on the other side of door minimally responsive. She presented to ED via EMS and was talking but disoriented. Shortly after entering ED she had a witnessed seizure (total of 3), which were abated with  ativan. There was some concern for benzodiazepine withdrawal, but patient reports taking all medications regularly and not having stopped. She does not recall seizing at home or any particular aura. Of note she did have a tick bite several weeks ago. She reports having this worked up as an outpatient, however, she does not recall the results.   She underwent CT scan at [facility] which demonstrated L frontal SAH and changes consistent with PRES. She was transported to [facility] for neurosurgical evaluation. She was seen by Dr. Franky Macho who does not feel that there is any role for neurosurgical evaluation. PCCM consulted for COPD.      SUBJECTIVE:  Feels better, less HA Taking some PO but didn't eat her breakfast  VITAL SIGNS: BP 162/89 mmHg  Pulse 87  Temp(Src) 98.3 F (36.8 C) (Oral)   Resp 17  Ht  (1.6 m)  Wt 166 lb 14.2 oz (75.7 kg)  BMI 29.57 kg/m2  SpO2 97%  HEMODYNAMICS:    VENTILATOR SETTINGS:    INTAKE / OUTPUT: I/O last 3 completed shifts: In: 3331.7 [I.V.:3016.7; IV Piggyback:315] Out: 4805 [Urine:4805]  PHYSICAL EXAMINATION: General:  Overweight female in NAD, on room air with sats 96% Neuro:  Alert, oriented, non-focal, NAD HEENT:  [place]/AT, PERRL, no JVD, oral mucosa moist Cardiovascular:  RRR, no MRG Lungs:  Clear bilateral breath sounds Abdomen:  Soft, non-tender, non-distended Musculoskeletal:  No acute deformity or ROM limitation Skin:  Grossly intact  LABS:  BMET  Recent Labs Lab 11/08/15 0102  NA 138  K 3.0*  CL 103  CO2 30  BUN <5*  CREATININE 0.54  GLUCOSE 182*    Electrolytes  Recent Labs Lab 11/08/15 0102  CALCIUM 7.5*  MG 1.6*  PHOS 2.5    CBC  Recent Labs Lab 11/08/15 0102  WBC 10.6*  HGB 14.1  HCT 42.6  PLT 188    Coag's  Recent Labs Lab 11/08/15 0102  APTT 23*  INR 1.01    Sepsis Markers No results for input(s): LATICACIDVEN, PROCALCITON, O2SATVEN in the last 168 hours.  ABG  Recent Labs Lab 11/07/15 2353  PHART 7.486*  PCO2ART 41.1  PO2ART 86.0    Liver Enzymes  Recent Labs Lab 11/08/15 0102  AST 19  ALT 20  ALKPHOS 71  BILITOT 0.9  ALBUMIN 3.1*    Cardiac Enzymes No results for input(s): TROPONINI, PROBNP in the last 168 hours.  Glucose No results for input(s): GLUCAP in the last 168 hours.  Imaging Ct Head Wo Contrast  11/08/2015  CLINICAL DATA:  Posterior reversible encephalopathy syndrome. Altered mental status. EXAM: CT HEAD WITHOUT CONTRAST TECHNIQUE: Contiguous axial images were obtained from the base of the skull through the vertex without intravenous contrast. COMPARISON:  11/07/2015 [facility]. FINDINGS: The examination suffers from motion but repeat imaging was performed. Again demonstrated is vasogenic edema within the white matter of both  cerebral hemispheres extending from the occipital regions to the posterior frontal regions consistent with the clinical diagnosis of posterior reversible encephalopathy. Subarachnoid hemorrhage in the left frontal region is again demonstrated, not increased. No intraparenchymal hemorrhage. No mass effect or shift. No extra-axial collection. The calvarium is unremarkable. Sinuses, middle ears and mastoids are clear. IMPRESSION: No change since yesterday. Vasogenic edema within the hemispheric white matter with a pattern consistent with the clinical diagnosis of posterior reversible encephalopathy. Subarachnoid hemorrhage in the left frontal region, unchanged. Electronically Signed   By: Paulina FusiMark  Shogry M.D.   On: 11/08/2015 12:17     STUDIES:  CT head 7/13 > small amount of left frontal subarachnoid blood, not suggestive an aneurysmal hemorrhage but of PRES.  CULTURES: Lyme 7/13 > RMSF 7/13 >   ANTIBIOTICS:  SIGNIFICANT EVENTS:  LINES/TUBES:   DISCUSSION: 55 year old female found on floor at home with AMS. 3 seizures in ED. Some concern for benzo withdrawal, however, recent history of tick bite. Also PRES on CT scan. Neurosurgery and neurology following. History of COPD without current acute exacerbation.   ASSESSMENT / PLAN:  SAH PRES Seizures - keppra,  - further imaging as per Dr Sueanne Margaritaabbell's plans  Chronic benzodiazepine use with potential withdrawal - Hold scheduled benzos - keppra as above  COPD without acute exacerbation -Supplemental O2 to keeps sats >90% -Resume home LABA / ICS > to [facility] for formulary substitution for Advair -PRN duoneb -No role steroids -Incentive spirometry  HTN HLD - BP parameters per primary in setting of PRES - hydralazine prn  Lactic acidosis > secondary to seizures - no value in trending lactic acid in this setting  Hypokalemia -Replete K -recheck BMP on 7/15  Tick Bite - Sent Lyme DNA by PCR, RMSF studies. [facility] - results not available at this time.  - pt indicates that [facility] the [facility]; makes this a probable non-issue.   Appears to be OK for floor bed, depending on NSGY plans regarding observation time and imaging for her ICH  PCCM will sign off. Please call if we can assist.    Levy Pupaobert Yenny Kosa, MD, PhD 11/09/2015, 1:38 PM [facility] [phone] or if no answer [phone]

## 2015-11-09 NOTE — Progress Notes (Signed)
Patient ID: Joan Newman, female   DOB: May 04, 1960, 55 y.o.   MRN: [id number] BP 171/79 mmHg  Pulse 100  Temp(Src) 99.2 F (37.3 C) (Oral)  Resp 20  Ht 5\' 3"  (1.6 m)  Wt 75.7 kg (166 lb 14.2 oz)  BMI 29.57 kg/m2  SpO2 95% Alert and oriented x 4, speech is clear and fluent Moving all extremities well Obviously improved will plan on discharge tomorrow.

## 2015-11-10 ENCOUNTER — Inpatient Hospital Stay (HOSPITAL_COMMUNITY): Payer: Self-pay

## 2015-11-10 LAB — BASIC METABOLIC PANEL
Anion gap: 15 (ref 5–15)
BUN: <5 mg/dL — ABNORMAL LOW (ref 6–20)
CALCIUM: 9.1 mg/dL (ref 8.9–10.3)
CHLORIDE: 97 mmol/L — AB (ref 101–111)
CO2: 28 mmol/L (ref 22–32)
Creatinine, Ser: 0.46 mg/dL (ref 0.44–1.00)
Glucose, Bld: 127 mg/dL — ABNORMAL HIGH (ref 65–99)
POTASSIUM: 2.5 mmol/L — AB (ref 3.5–5.1)
SODIUM: 140 mmol/L (ref 135–145)

## 2015-11-10 LAB — CBC
HEMATOCRIT: 46.5 % — AB (ref 36.0–46.0)
HEMOGLOBIN: 15.4 g/dL — AB (ref 12.0–15.0)
MCH: 32.8 pg (ref 26.0–34.0)
MCHC: 33.1 g/dL (ref 30.0–36.0)
MCV: 98.9 fL (ref 78.0–100.0)
Platelets: 200 10*3/uL (ref 150–400)
RBC: 4.7 MIL/uL (ref 3.87–5.11)
RDW: 14.3 % (ref 11.5–15.5)
WBC: 9.5 10*3/uL (ref 4.0–10.5)

## 2015-11-10 LAB — MAGNESIUM: Magnesium: 1.6 mg/dL — ABNORMAL LOW (ref 1.7–2.4)

## 2015-11-10 LAB — PHOSPHORUS: PHOSPHORUS: 3.6 mg/dL (ref 2.5–4.6)

## 2015-11-10 MED ORDER — GADOBENATE DIMEGLUMINE 529 MG/ML IV SOLN
15.0000 mL | Freq: Once | INTRAVENOUS | Status: AC | PRN
  Administered 2015-11-10: 15 mL via INTRAVENOUS

## 2015-11-10 MED ORDER — POTASSIUM CHLORIDE 10 MEQ/100ML IV SOLN
10.0000 meq | INTRAVENOUS | Status: AC
  Administered 2015-11-10 (×4): 10 meq via INTRAVENOUS
  Filled 2015-11-10 (×4): qty 100

## 2015-11-10 MED ORDER — LABETALOL HCL 5 MG/ML IV SOLN
20.0000 mg | INTRAVENOUS | Status: DC | PRN
  Administered 2015-11-11: 20 mg via INTRAVENOUS
  Filled 2015-11-10: qty 4

## 2015-11-10 MED ORDER — POTASSIUM CHLORIDE CRYS ER 20 MEQ PO TBCR
20.0000 meq | EXTENDED_RELEASE_TABLET | Freq: Two times a day (BID) | ORAL | Status: AC
  Administered 2015-11-10 – 2015-11-13 (×8): 20 meq via ORAL
  Filled 2015-11-10 (×8): qty 1

## 2015-11-10 MED ORDER — FUROSEMIDE 10 MG/ML IJ SOLN
40.0000 mg | Freq: Once | INTRAMUSCULAR | Status: AC
  Administered 2015-11-10: 40 mg via INTRAVENOUS
  Filled 2015-11-10: qty 4

## 2015-11-10 NOTE — Progress Notes (Signed)
Subjective:  Seizures related to PRES  Exam: Filed Vitals:   11/10/15 0550 11/10/15 0925  BP: 170/81 151/93  Pulse: 101 100  Temp: 98.8 F (37.1 C) 99 F (37.2 C)  Resp: 20 18    HEENT-  Normocephalic, no lesions, without obvious abnormality.  Normal external eye and conjunctiva.  Normal TM's bilaterally.  Normal auditory canals and external ears. Normal external nose, mucus membranes and septum.  Normal pharynx. Cardiovascular- regular rate and rhythm, S1, S2 normal, no murmur, click, rub or gallop, pulses palpable throughout   Lungs- chest clear, no wheezing, rales, normal symmetric air entry, Heart exam - S1, S2 normal, no murmur, no gallop, rate regular Abdomen- soft, non-tender; bowel sounds normal; no masses,  no organomegaly Extremities- less then 2 second capillary refill Lymph-no adenopathy palpable Musculoskeletal-no joint tenderness, deformity or swelling Skin-warm and dry, no hyperpigmentation, vitiligo, or suspicious lesions    Gen: In bed, NAD MS: alert, oriented, mild headache  CN: intact Motor: MAE  Sensory:intact DTR: wnl  Pertinent Labs/Diagnostics: reviewed    Impression:   Joan Newman experienced a few seizures upon initial evaluation most likely to a diagnosis of PRES.  She is presently on Keppra.  There was some question of benzodiazepine withdrawal.  Typically, seizures of this type are considered provoked and do not require long-term antiepileptic therapy.  Treatment for 7-10 days is usually considered sufficient.  The repeat MRI appears unchanged.  This was discussed with Joan Newman.  She is likely ready for discharge in the near future.   Recommendations: 1) Ready for discharge as per primary team.   Joan Newman, M.D. Neurohospitalist Phone: [phone]   11/10/2015, 9:46 AM

## 2015-11-10 NOTE — Progress Notes (Signed)
Overall stable. Patient still note some occipital headache and still has some visual symptoms. No other ongoing problems. Patient is mobilizing marginally and does not have home support. She does not feel ready for discharge today.

## 2015-11-10 NOTE — Progress Notes (Addendum)
Critical lab K+ of 2.5. MD notified with new of order of 40 mEq IV x4 run and PO for 4 days.

## 2015-11-11 ENCOUNTER — Encounter (HOSPITAL_COMMUNITY): Payer: Self-pay

## 2015-11-11 MED ORDER — BISACODYL 10 MG RE SUPP: 10.0000 mg | Freq: Every day | RECTAL | Status: DC | PRN

## 2015-11-11 MED ORDER — LISINOPRIL 20 MG PO TABS
20.0000 mg | ORAL_TABLET | Freq: Two times a day (BID) | ORAL | Status: DC
  Administered 2015-11-11 – 2015-11-23 (×24): 20 mg via ORAL
  Filled 2015-11-11 (×25): qty 1

## 2015-11-11 MED ORDER — METOPROLOL TARTRATE 25 MG PO TABS
25.0000 mg | ORAL_TABLET | Freq: Two times a day (BID) | ORAL | Status: DC
  Administered 2015-11-11 – 2015-11-23 (×22): 25 mg via ORAL
  Filled 2015-11-11 (×24): qty 1

## 2015-11-11 MED ORDER — ASPIRIN 81 MG PO CHEW
81.0000 mg | CHEWABLE_TABLET | Freq: Every day | ORAL | Status: DC
  Administered 2015-11-12 – 2015-11-23 (×12): 81 mg via ORAL
  Filled 2015-11-11 (×12): qty 1

## 2015-11-11 MED ORDER — MAGNESIUM SULFATE IN D5W 1-5 GM/100ML-% IV SOLN
1.0000 g | Freq: Once | INTRAVENOUS | Status: AC
  Administered 2015-11-11: 1 g via INTRAVENOUS
  Filled 2015-11-11: qty 100

## 2015-11-11 MED ORDER — SENNA 8.6 MG PO TABS
1.0000 | ORAL_TABLET | Freq: Two times a day (BID) | ORAL | Status: DC
  Administered 2015-11-11 – 2015-11-22 (×20): 8.6 mg via ORAL
  Filled 2015-11-11 (×24): qty 1

## 2015-11-11 MED ORDER — ALUM & MAG HYDROXIDE-SIMETH 200-200-20 MG/5ML PO SUSP
30.0000 mL | Freq: Four times a day (QID) | ORAL | Status: DC | PRN
  Administered 2015-11-13: 30 mL via ORAL
  Filled 2015-11-11: qty 30

## 2015-11-11 MED ORDER — ATORVASTATIN CALCIUM 80 MG PO TABS
80.0000 mg | ORAL_TABLET | Freq: Every day | ORAL | Status: DC
  Administered 2015-11-11 – 2015-11-22 (×12): 80 mg via ORAL
  Filled 2015-11-11 (×12): qty 1

## 2015-11-11 MED ORDER — MAGNESIUM SULFATE 50 % IJ SOLN: 1.0000 g | Freq: Once | INTRAMUSCULAR | Status: DC

## 2015-11-11 MED ORDER — POLYETHYLENE GLYCOL 3350 17 G PO PACK
17.0000 g | PACK | Freq: Every day | ORAL | Status: DC | PRN
  Administered 2015-11-11: 17 g via ORAL
  Filled 2015-11-11: qty 1

## 2015-11-11 MED ORDER — DOCUSATE SODIUM 100 MG PO CAPS
100.0000 mg | ORAL_CAPSULE | Freq: Two times a day (BID) | ORAL | Status: DC
  Administered 2015-11-11 – 2015-11-23 (×24): 100 mg via ORAL
  Filled 2015-11-11 (×24): qty 1

## 2015-11-11 NOTE — Clinical Documentation Improvement (Signed)
Neuro Surgery  Based on the clinical findings below, please document any associated diagnoses/conditions the patient has or may have.   Cerebral Edema  Brain Herniation  Other  Clinically Undetermined   Please update your documentation within the medical record to reflect your response to this query. Thank you.  Supporting Information: CT Scan- Head 11/08/15 IMPRESSION: No change since yesterday. Vasogenic edema within the hemispheric white matter with a pattern consistent with the clinical diagnosis of posterior reversible encephalopathy. Subarachnoid hemorrhage in the left frontal region, unchanged.  Please exercise your independent, professional judgment when responding. A specific answer is not anticipated or expected.  Thank You, Nevin BloodgoodJoan B Alexander Aument, RN, BSN, CCDS,Clinical Documentation Specialist:  [phone]  [phone]=[facility]- Health Information Management

## 2015-11-11 NOTE — Progress Notes (Signed)
No acute events Occipital headache Resume home meds

## 2015-11-11 NOTE — Progress Notes (Signed)
Attempted to call on-call provider with Neurosurgery 2 separate times via the answering service but never heard back. Concerns that I wanted to express were the following: No BM in 7 days:- Standing orders initiated for Senokot, Miralax, and Colace. Patient smokes 2 packs a day and is requesting Nicotine patch= Unresolved Magnesium level 11/10/15 @ 0400 was low at 1.6 and no supplementation given= Unresolved C/o N/V since admission and even days before while at home= Unresolved Home medications haven't been started including BP medications, etc/BP had been elevated until getting Labetalol IV= still not ordered. Pupils are 7mm and continues to c/o severe H/A= Unresolved Patients BP has come down significantly to 112/63 after Labetalol IV. Didn't improve BP though. Charge nurse notified of unsuccessful attempts to reach on-call provider.

## 2015-11-11 NOTE — Progress Notes (Signed)
Patient continues to c/o nausea, stated last night she vomited. Unsure when her last BM was, but knows its been since before admission to the hospital (5-6 days ago). Reports not having any appetite. Patient has low grade fever 99.2-100.2. Tylenol does not effect it. Also c/o a H/A. BP continues to be elevated even after Hydralazine 20 mg and Lasix 40 mg IV. Going to give Labetalol IV next. Patient states that she has decreased the frequency of her Klonopin at home but has not stopped it completely. States she has only been taking it interminttenly over the past 6 months. Also states that she was recently [facility] in June with a cc of AMS, but was diagnosed with a NSTEMI. States they did a stress test there but she doesn't remember an echo being done. Also states she continues to smoke and has been craving cigarettes today. Patients reports alternating between constipation/diarrhea but has never had a colonoscopy. Patients reports having N/V prior to coming to the hospital for several days as well. Patient reports taking her medication as prescribed at home. She also lives alone and says recently she has had more episodes of confusion. Concerned about patient going home while still not tolerating food, increased BP, N/V, and low grade temp. Concerns expressed with on call provider.

## 2015-11-12 LAB — BASIC METABOLIC PANEL
Anion gap: 10 (ref 5–15)
BUN: <5 mg/dL — ABNORMAL LOW (ref 6–20)
CHLORIDE: 96 mmol/L — AB (ref 101–111)
CO2: 33 mmol/L — ABNORMAL HIGH (ref 22–32)
CREATININE: 0.54 mg/dL (ref 0.44–1.00)
Calcium: 9.4 mg/dL (ref 8.9–10.3)
Glucose, Bld: 179 mg/dL — ABNORMAL HIGH (ref 65–99)
Potassium: 2.8 mmol/L — ABNORMAL LOW (ref 3.5–5.1)
Sodium: 139 mmol/L (ref 135–145)

## 2015-11-12 MED ORDER — BUPIVACAINE HCL (PF) 0.25 % IJ SOLN
INTRAMUSCULAR | Status: AC
Start: 2015-11-12 — End: 2015-11-12
  Filled 2015-11-12: qty 30

## 2015-11-12 NOTE — Progress Notes (Signed)
Patient ID: Joan Newman, female   DOB: 08-03-1960, 55 y.o.   MRN: [id number] BP 146/82 mmHg  Pulse 79  Temp(Src) 99.4 F (37.4 C) (Oral)  Resp 20  Ht 5\' 3"  (1.6 m)  Wt 75.7 kg (166 lb 14.2 oz)  BMI 29.57 kg/m2  SpO2 96% Alert and oriented x 4, speech is clear and fluent Moving all extremities well Placement is the issue

## 2015-11-12 NOTE — Progress Notes (Signed)
Occupational Therapy Treatment Patient Details Name: Joan Newman MRN: [id number] DOB: [birth year] Today's Date: 11/12/2015    History of present illness Pt admitted on 11/07/15 with seizure activity and SAH. PMH which includes HTN, HLD, COPD, and Anxiety. Records from OSH also report PMH including MI, CAD s/p DES to LAD   OT comments  Pt requesting to get help prior to home due to lack of assistance and doesn't feel safe being alone. Pt demonstrates cognitive and balance deficits at this time. Pt could benefit from SNF level care.    Follow Up Recommendations  SNF;Supervision/Assistance - 24 hour    Equipment Recommendations  Other (comment)    Recommendations for Other Services Speech consult    Precautions / Restrictions Precautions Precautions: Fall       Mobility Bed Mobility Overal bed mobility: Modified Independent                Transfers Overall transfer level: Needs assistance   Transfers: Sit to/from Stand Sit to Stand: Supervision              Balance Overall balance assessment: Needs assistance Sitting-balance support: No upper extremity supported;Feet supported Sitting balance-Leahy Scale: Fair     Standing balance support: No upper extremity supported;During functional activity Standing balance-Leahy Scale: Fair                     ADL Overall ADL's : Needs assistance/impaired                         Toilet Transfer: Min guard           Functional mobility during ADLs: Min guard General ADL Comments: pt demonstrates decr gait speed and verbalized concerns with d/c home. pt verbalized I know I am having trouble remembering. pt lives alone and will be responsible for bill and pill management      Vision                 Additional Comments: pt noted to have peripheral deficits. Pt demonstrates 45 degrees bil vision field deficits. Pt able to complete oculomotor movement in all directions but unable to  define objects past 45 degrees in peripheral vision. pt reports L worse than R. pt reports when reading she is missing the first two letters  and recognized. pt able to tilt head and correct errors and recognized errors. Pt does report not having reading glasses at this time.    Perception     Praxis      Cognition   Behavior During Therapy: WFL for tasks assessed/performed Overall Cognitive Status: Impaired/Different from baseline Area of Impairment: Memory;Safety/judgement;Following commands;Problem solving   Current Attention Level: Alternating Memory: Decreased short-term memory  Following Commands: Follows multi-step commands with increased time Safety/Judgement: Decreased awareness of safety;Decreased awareness of deficits Awareness: Emergent Problem Solving: Slow processing;Difficulty sequencing General Comments: Pt demonstrates deficit with multi tasking such as tossing a ball and saying abcs. pt states "AbC' toss "DEF" toss "IHG" and threw ball to therapist stating "I have to get rid of it " and laughs. pt notes trouble recalling ABCs during task. pt asked to bouce dribble catch and pt required multiple attempts to sequence task. pt following commands turn , look up and down, turn R. pt however when challenged with cognitive task stops.     Extremity/Trunk Assessment               Exercises  Shoulder Instructions       General Comments      Pertinent Vitals/ Pain       Pain Assessment: No/denies pain  Home Living                                          Prior Functioning/Environment              Frequency Min 2X/week     Progress Toward Goals  OT Goals(current goals can now be found in the care plan section)  Progress towards OT goals: Progressing toward goals  Acute Rehab OT Goals Patient Stated Goal: to go home OT Goal Formulation: With patient Time For Goal Achievement: 11/23/15 Potential to Achieve Goals: Good ADL  Goals Pt Will Perform Grooming: with modified independence;standing Pt Will Perform Upper Body Bathing: with modified independence;sitting Pt Will Perform Lower Body Bathing: with modified independence;sit to/from stand Pt Will Transfer to Toilet: with modified independence;ambulating;regular height toilet Pt Will Perform Toileting - Clothing Manipulation and hygiene: with modified independence;sit to/from stand Additional ADL Goal #1: Pt will complete unfamiliar task with min verbal cues for sequencing and initiation.  (progressing but no met)  Plan Discharge plan remains appropriate    Co-evaluation                 End of Session Equipment Utilized During Treatment: Gait belt   Activity Tolerance Patient tolerated treatment well   Patient Left in bed;with call bell/phone within reach;with bed alarm set   Nurse Communication Mobility status;Precautions        Time: 3734-2876 OT Time Calculation (min): 25 min  Charges: OT General Charges $OT Visit: 1 Procedure OT Treatments $Therapeutic Activity: 8-22 mins  Parke Poisson B 11/12/2015, 3:23 PM   Jeri Modena   OTR/L Pager: [phone] Office: [phone] .

## 2015-11-12 NOTE — Progress Notes (Signed)
Patient currently does not appear to have any skilled needs in addition to no insurance and we are not able to provide an LOG for cognitive supervision.   CSW signing off. Please re-consult if needed.  Osborne Cascoadia Oluwatimileyin Vivier LCSWA [phone]

## 2015-11-12 NOTE — Progress Notes (Signed)
Physical Therapy Treatment Patient Details Name: Joan Newman MRN: [id number] DOB: October 17, 1960 Today's Date: 11/12/2015    History of Present Illness Pt admitted on 11/07/15 with seizure activity and SAH. PMH which includes HTN, HLD, COPD, and Anxiety. Records from OSH also report PMH including MI, CAD s/p DES to LAD    PT Comments    Pt progressing steadily.  Emphasis on following multilevel commands during ambulation with multi step tasks at the same time while managing to stay in balance  Follow Up Recommendations  SNF;Supervision/Assistance - 24 hour     Equipment Recommendations  Other (comment)    Recommendations for Other Services       Precautions / Restrictions Precautions Precautions: Fall    Mobility  Bed Mobility Overal bed mobility: Modified Independent                Transfers Overall transfer level: Needs assistance   Transfers: Sit to/from Stand Sit to Stand: Supervision            Ambulation/Gait Ambulation/Gait assistance: Min guard;Min assist Ambulation Distance (Feet): 200 Feet Assistive device: 1 person hand held assist;None Gait Pattern/deviations: Step-through pattern Gait velocity: decreased Gait velocity interpretation: Below normal speed for age/gender General Gait Details: shorter step length, progresively more confidence with gait as trial progressed. Ued ball to challenge pt's balance and ability to manage a multi-task activity and ambulate at the sane time   Stairs            Wheelchair Mobility    Modified Rankin (Stroke Patients Only) Modified Rankin (Stroke Patients Only) Pre-Morbid Rankin Score: No symptoms Modified Rankin: Moderate disability     Balance Overall balance assessment: No apparent balance deficits (not formally assessed) Sitting-balance support: No upper extremity supported Sitting balance-Leahy Scale: Good     Standing balance support: No upper extremity supported;During functional  activity Standing balance-Leahy Scale: Fair                 High Level Balance Comments: Completing multi step tasks to command while walking with mild instability, but not significant deviation or LOB    Cognition Arousal/Alertness: Awake/alert Behavior During Therapy: WFL for tasks assessed/performed Overall Cognitive Status: Impaired/Different from baseline Area of Impairment: Memory;Safety/judgement;Following commands;Problem solving   Current Attention Level: Alternating Memory: Decreased short-term memory Following Commands: Follows multi-step commands with increased time Safety/Judgement: Decreased awareness of safety;Decreased awareness of deficits Awareness: Emergent Problem Solving: Slow processing;Difficulty sequencing General Comments: Pt demonstrates deficit with multi tasking such as tossing a ball and saying abcs. pt states "AbC' toss "DEF" toss "IHG" and threw ball to therapist stating "I have to get rid of it " and laughs. pt notes trouble recalling ABCs during task. pt asked to bouce dribble catch and pt required multiple attempts to sequence task. pt following commands turn , look up and down, turn R. pt however when challenged with cognitive task stops.     Exercises      General Comments        Pertinent Vitals/Pain Pain Assessment: No/denies pain    Home Living                      Prior Function            PT Goals (current goals can now be found in the care plan section) Acute Rehab PT Goals Patient Stated Goal: to go home PT Goal Formulation: With patient Time For Goal Achievement: 11/23/15 Potential to Achieve Goals: Good Progress  towards PT goals: Progressing toward goals    Frequency  Min 3X/week    PT Plan Current plan remains appropriate    Co-evaluation             End of Session Equipment Utilized During Treatment: Gait belt Activity Tolerance: Patient tolerated treatment well Patient left: in bed;with call  bell/phone within reach     Time: 1429-1454 PT Time Calculation (min) (ACUTE ONLY): 25 min  Charges:  $Gait Training: 8-22 mins                    G Codes:      Deshonna Trnka, Eliseo Gum 11/12/2015, 3:50 PM 11/12/2015  Byron Bing, PT [phone]  (pager)

## 2015-11-13 MED ORDER — ZOLPIDEM TARTRATE 5 MG PO TABS
5.0000 mg | ORAL_TABLET | Freq: Every evening | ORAL | Status: DC | PRN
  Administered 2015-11-13 – 2015-11-22 (×10): 5 mg via ORAL
  Filled 2015-11-13 (×12): qty 1

## 2015-11-13 MED ORDER — ZOLPIDEM TARTRATE 5 MG PO TABS: 10.0000 mg | ORAL_TABLET | Freq: Every evening | ORAL | Status: DC | PRN

## 2015-11-13 NOTE — Progress Notes (Signed)
Patient complains that she feels not emptying bladder when voiding.Bladder scanned patient after voiding 13 cc left.Reassured patient that bladder being emptied will continue to monitor.

## 2015-11-13 NOTE — Progress Notes (Signed)
Patient ID: Joan Newman, female   DOB: 09-Jul-1960, 55 y.o.   MRN: [id number] BP 132/71 mmHg  Pulse 62  Temp(Src) 98.1 F (36.7 C) (Oral)  Resp 16  Ht 5\' 3"  (1.6 m)  Wt 75.7 kg (166 lb 14.2 oz)  BMI 29.57 kg/m2  SpO2 96% [name] will not be discharged when PT, and OT have recommended 24 hr supervision. They may change their minds tomorrow when she is seen. CSW will have to address this, and find a solution.  Alert, following all commands Moving all extremities

## 2015-11-14 MED ORDER — CLONAZEPAM 1 MG PO TABS
1.0000 mg | ORAL_TABLET | Freq: Every evening | ORAL | Status: DC | PRN
  Administered 2015-11-14: 1 mg via ORAL
  Filled 2015-11-14: qty 1

## 2015-11-14 MED ORDER — NITROGLYCERIN 0.4 MG SL SUBL: 0.4000 mg | SUBLINGUAL_TABLET | SUBLINGUAL | Status: DC | PRN

## 2015-11-14 MED ORDER — ATORVASTATIN CALCIUM 80 MG PO TABS: 80.0000 mg | ORAL_TABLET | Freq: Every evening | ORAL | Status: DC

## 2015-11-14 MED ORDER — ASPIRIN 81 MG PO CHEW: 81.0000 mg | CHEWABLE_TABLET | Freq: Every day | ORAL | Status: DC

## 2015-11-14 MED ORDER — PANTOPRAZOLE SODIUM 40 MG PO TBEC
40.0000 mg | DELAYED_RELEASE_TABLET | Freq: Every day | ORAL | Status: DC
  Administered 2015-11-14 – 2015-11-23 (×10): 40 mg via ORAL
  Filled 2015-11-14 (×10): qty 1

## 2015-11-14 MED ORDER — ALBUTEROL SULFATE (2.5 MG/3ML) 0.083% IN NEBU: 3.0000 mL | INHALATION_SOLUTION | RESPIRATORY_TRACT | Status: DC | PRN

## 2015-11-14 MED ORDER — ZOLPIDEM TARTRATE 5 MG PO TABS: 5.0000 mg | ORAL_TABLET | Freq: Every evening | ORAL | Status: DC | PRN

## 2015-11-14 MED ORDER — LISINOPRIL 20 MG PO TABS: 20.0000 mg | ORAL_TABLET | Freq: Two times a day (BID) | ORAL | Status: DC

## 2015-11-14 MED ORDER — METOPROLOL TARTRATE 25 MG PO TABS: 25.0000 mg | ORAL_TABLET | Freq: Two times a day (BID) | ORAL | Status: DC

## 2015-11-14 MED ORDER — DIPHENHYDRAMINE-APAP (SLEEP) 25-500 MG PO TABS: 2.0000 | ORAL_TABLET | Freq: Every evening | ORAL | Status: DC | PRN

## 2015-11-14 MED ORDER — ACETAMINOPHEN 500 MG PO TABS: 500.0000 mg | ORAL_TABLET | Freq: Every evening | ORAL | Status: DC | PRN

## 2015-11-14 MED ORDER — QUINAPRIL HCL 10 MG PO TABS: 20.0000 mg | ORAL_TABLET | Freq: Every day | ORAL | Status: DC

## 2015-11-14 MED ORDER — MOMETASONE FURO-FORMOTEROL FUM 200-5 MCG/ACT IN AERO: 2.0000 | INHALATION_SPRAY | Freq: Two times a day (BID) | RESPIRATORY_TRACT | Status: DC

## 2015-11-14 MED ORDER — DIPHENHYDRAMINE HCL 25 MG PO CAPS: 25.0000 mg | ORAL_CAPSULE | Freq: Every evening | ORAL | Status: DC | PRN

## 2015-11-14 MED ORDER — POTASSIUM CHLORIDE CRYS ER 20 MEQ PO TBCR
60.0000 meq | EXTENDED_RELEASE_TABLET | Freq: Every day | ORAL | Status: DC
Start: 2015-11-14 — End: 2015-11-23
  Administered 2015-11-14 – 2015-11-23 (×10): 60 meq via ORAL
  Filled 2015-11-14 (×10): qty 3

## 2015-11-14 NOTE — Progress Notes (Signed)
Physical Therapy Treatment Patient Details Name: Joan Newman MRN: [id number] DOB: 03-25-1961 Today's Date: 11/14/2015    History of Present Illness Pt admitted on 11/07/15 with seizure activity and SAH. PMH which includes HTN, HLD, COPD, and Anxiety. Records from OSH also report PMH including MI, CAD s/p DES to LAD    PT Comments    Pt progressing slowly with ?motor/idea planning and execution.  There is a visual component that complicates the situation.  Mobility is generally safe and steady until something distracts her or she is given a more complex set of commands and then she shows a decreased ability to plan and execute.  Pt should probably not have to be alone from the cognition standpoint more than the mobility/balance aspect, but may have to go directly home however due to lack of insurance.  Follow Up Recommendations  SNF (but likely will have to go home with no therapy)     Equipment Recommendations  None recommended by PT    Recommendations for Other Services       Precautions / Restrictions Precautions Precautions: Fall    Mobility  Bed Mobility Overal bed mobility: Modified Independent                Transfers Overall transfer level: Modified independent   Transfers: Sit to/from Stand Sit to Stand: Modified independent (Device/Increase time)         General transfer comment: mildly unsteady on initial standing  Ambulation/Gait Ambulation/Gait assistance: Supervision Ambulation Distance (Feet): 400 Feet Assistive device: None Gait Pattern/deviations: Step-through pattern Gait velocity: decreased Gait velocity interpretation: Below normal speed for age/gender General Gait Details: slower, tentative gait with episodes of instability, but not overt LOB when her focus is drawn off of the immediate task or with visual balance challanges.   Stairs Stairs: Yes Stairs assistance: Min guard Stair Management: One rail Right;Alternating  pattern;Forwards Number of Stairs: 5 General stair comments: steady and generally steady,  Makes some unusual choices for safety ie turning around on the step away from the rail so whe has no hand hold.  Wheelchair Mobility    Modified Rankin (Stroke Patients Only) Modified Rankin (Stroke Patients Only) Pre-Morbid Rankin Score: No symptoms Modified Rankin: Moderate disability     Balance Overall balance assessment: Needs assistance   Sitting balance-Leahy Scale: Good       Standing balance-Leahy Scale: Good Standing balance comment: higher level tasks produce some mild instability which pt can correct for.  Pt just can not handle or keep up with the "firing off" of commands on the fly and gets more unsteady trying to do what is asked of her.               High Level Balance Comments: Completing multi step tasks to command while walking with mild instability, but not significant deviation or LOB    Cognition Arousal/Alertness: Awake/alert Behavior During Therapy: WFL for tasks assessed/performed Overall Cognitive Status: Impaired/Different from baseline Area of Impairment: Following commands;Safety/judgement;Awareness;Problem solving   Current Attention Level: Alternating   Following Commands: Follows multi-step commands with increased time;Follows multi-step commands inconsistently Safety/Judgement: Decreased awareness of safety;Decreased awareness of deficits Awareness: Emergent Problem Solving: Slow processing;Difficulty sequencing (repetitive cues)      Exercises      General Comments        Pertinent Vitals/Pain Pain Assessment: Faces Faces Pain Scale: Hurts a little bit Pain Location: headache Pain Descriptors / Indicators: Headache Pain Intervention(s): Monitored during session    Home Living  Prior Function            PT Goals (current goals can now be found in the care plan section) Acute Rehab PT Goals Patient  Stated Goal: to go home PT Goal Formulation: With patient Time For Goal Achievement: 11/23/15 Potential to Achieve Goals: Good Progress towards PT goals: Progressing toward goals    Frequency  Min 3X/week    PT Plan Discharge plan needs to be updated    Co-evaluation             End of Session   Activity Tolerance: Patient tolerated treatment well;Patient limited by fatigue Patient left: in bed;with call bell/phone within reach     Time: 5784-69621437-1517 PT Time Calculation (min) (ACUTE ONLY): 40 min  Charges:  $Gait Training: 8-22 mins $Therapeutic Activity: 23-37 mins                    G Codes:      Jancarlo Biermann, Eliseo GumKenneth V 11/14/2015, 3:46 PM 11/14/2015  Hayward BingKen Blenda Wisecup, PT [phone]  (pager)

## 2015-11-14 NOTE — Progress Notes (Signed)
Patient ID: Joan Newman, female   DOB: Aug 31, 1960, 55 y.o.   MRN: [id number] BP 145/72 mmHg  Pulse 61  Temp(Src) 97.8 F (36.6 C) (Oral)  Resp 18  Ht 5\' 3"  (1.6 m)  Wt 75.7 kg (166 lb 14.2 oz)  BMI 29.57 kg/m2  SpO2 95% Alert and oriented, following all commands Moving all extremities Waiting on placement

## 2015-11-14 NOTE — Progress Notes (Signed)
CSW left another message for patient's daughter to discuss discharge plan.  Osborne Cascoadia Justis Closser LCSWA [phone]

## 2015-11-15 ENCOUNTER — Encounter (HOSPITAL_COMMUNITY): Payer: Self-pay | Admitting: *Deleted

## 2015-11-15 MED ORDER — CLONAZEPAM 1 MG PO TABS
1.0000 mg | ORAL_TABLET | Freq: Three times a day (TID) | ORAL | Status: DC | PRN
  Administered 2015-11-15 – 2015-11-23 (×19): 1 mg via ORAL
  Filled 2015-11-15 (×19): qty 1

## 2015-11-15 NOTE — Progress Notes (Signed)
PT Cancellation Note  Patient Details Name: Joan Newman MRN: [id number] DOB: 1960/09/21   Cancelled Treatment:    Reason Eval/Treat Not Completed: Patient declined, no reason specified.  [name] and couldn't think about getting up right now.  Will try back as able. 11/15/2015  [place] BingKen Tonae Livolsi, PT [phone]  (pager)   Kobie Matkins, Eliseo GumKenneth V 11/15/2015, 12:40 PM

## 2015-11-15 NOTE — Progress Notes (Signed)
CSW continuing to search for ALF facilities that accept an LOG.  Osborne Cascoadia Johnthan Axtman LCSWA [phone]

## 2015-11-15 NOTE — Clinical Social Work Note (Signed)
Clinical Social Work Assessment  Patient Details  Name: Joan Newman MRN: [id number] Date of Birth: [birth year]  Date of referral:  11/15/15               Reason for consult:  Facility Placement                Permission sought to share information with:  Facility Medical sales representativeContact Representative, Family Supports Permission granted to share information::  Yes, Verbal Permission Granted  Name::     Advertising account executiveAmanda  Agency::  ALFs  Relationship::  Daughter  Contact Information:     Housing/Transportation Living arrangements for the past 2 months:  Single Family Home Source of Information:  Patient Patient Interpreter Needed:  None Criminal Activity/Legal Involvement Pertinent to Current Situation/Hospitalization:  No - Comment as needed Significant Relationships:  Adult Children Lives with:  Self Do you feel safe going back to the place where you live?  No Need for family participation in patient care:  No (Coment)  Care giving concerns:  MD is requesting that patient be placed. Patient is not appropriate for SNF, so patient is interested in ALF placement.    Social Worker assessment / plan:  CSW spoke with patient regarding placement.  Employment status:  Disabled (Comment on whether or not currently receiving Disability) (Medicaid pending) Insurance information:  Self Pay (Medicaid Pending) PT Recommendations:  Skilled Nursing Facility Information / Referral to community resources:     Patient/Family's Response to care:  Patient would like to be placed and does not want to return to her trailer alone. 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  Patient/Family's Understanding of and Emotional Response to Diagnosis, Current Treatment, and Prognosis:  No questions/concerns about plan or treatment.    Emotional Assessment Appearance:  Appears stated age Attitude/Demeanor/Rapport:  Other (Appropriate) Affect (typically observed):  Accepting, Appropriate Orientation:   Oriented to Self, Oriented to Place, Oriented to  Time, Oriented to Situation Alcohol / Substance use:  Not Applicable Psych involvement (Current and /or in the community):  No (Comment)  Discharge Needs  Concerns to be addressed:  Care Coordination Readmission within the last 30 days:  No Current discharge risk:  None Barriers to Discharge:  Continued Medical Work up   Joan Newman, LCSWA 11/15/2015, 5:30 PM

## 2015-11-15 NOTE — Progress Notes (Signed)
Patient ID: Joan Newman, female   DOB: 30-Jul-1960, 55 y.o.   MRN: [id number] BP 146/72 mmHg  Pulse 65  Temp(Src) 98.4 F (36.9 C) (Oral)  Resp 18  Ht 5\' 3"  (1.6 m)  Wt 75.7 kg (166 lb 14.2 oz)  BMI 29.57 kg/m2  SpO2 96% Alert and oriented x 4, speech is clear fluent Moving all extremities Complaining of headache. Will start klonopin

## 2015-11-15 NOTE — NC FL2 (Signed)
[place] MEDICAID FL2 LEVEL OF CARE SCREENING TOOL     IDENTIFICATION  Patient Name: Joan Newman Birthdate: July 01, 1960 Sex: female Admission Date (Current Location): 11/07/2015  [facility] [place]   Facility and Address:  [facility] [place], [address], [place], [place] [address]      Provider Number: [id number]  Attending Physician Name and Address:  Coletta MemosKyle Eshan Trupiano, MD  Relative Name and Phone Number:  Marchelle Folksmanda, daughter, [phone]    Current Level of Care: Hospital Recommended Level of Care: Assisted Living Facility Prior Approval Number:    Date Approved/Denied:   PASRR Number:    Discharge Plan: Other (Comment) (ALF)    Current Diagnoses: Patient Active Problem List   Diagnosis Date Noted  . PRES (posterior reversible encephalopathy syndrome) 11/08/2015  . Chronic obstructive pulmonary disease (HCC)   . Essential hypertension   . Tick bite     Orientation RESPIRATION BLADDER Height & Weight     Self, Time, Situation, Place  Normal Continent Weight: 75.7 kg (166 lb 14.2 oz) Height:  5\' 3"  (160 cm)  BEHAVIORAL SYMPTOMS/MOOD NEUROLOGICAL BOWEL NUTRITION STATUS    Convulsions/Seizures Continent Diet (Please see DC Summary)  AMBULATORY STATUS COMMUNICATION OF NEEDS Skin   Supervision Verbally Normal                       Personal Care Assistance Level of Assistance  Bathing, Feeding, Dressing Bathing Assistance: Independent Feeding assistance: Independent Dressing Assistance: Independent     Functional Limitations Info             SPECIAL CARE FACTORS FREQUENCY  PT (By licensed PT)     PT Frequency: min 3x/week              Contractures      Additional Factors Info  Code Status, Allergies Code Status Info: Full Allergies Info: NKA           Current Medications (11/15/2015):  This is the current hospital active medication list Current Facility-Administered Medications  Medication Dose  Route Frequency Provider Last Rate Last Dose  . 0.9 %  sodium chloride infusion  250 mL Intravenous PRN Coletta MemosKyle Avrey Flanagin, MD      . acetaminophen (TYLENOL) tablet 650 mg  650 mg Oral Q6H PRN Coletta MemosKyle Adlynn Lowenstein, MD   650 mg at 11/11/15 1959   Or  . acetaminophen (TYLENOL) suppository 650 mg  650 mg Rectal Q6H PRN Coletta MemosKyle Mykenzi Vanzile, MD      . diphenhydrAMINE (BENADRYL) capsule 25 mg  25 mg Oral QHS PRN Coletta MemosKyle Trenia Tennyson, MD       And  . acetaminophen (TYLENOL) tablet 500 mg  500 mg Oral QHS PRN Coletta MemosKyle Ardie Mclennan, MD      . albuterol (PROVENTIL) (2.5 MG/3ML) 0.083% nebulizer solution 3 mL  3 mL Inhalation Q4H PRN Loura HaltBenjamin Jared Ditty, MD      . alum & mag hydroxide-simeth (MAALOX/MYLANTA) 200-200-20 MG/5ML suspension 30 mL  30 mL Oral Q6H PRN Coletta MemosKyle Sean Macwilliams, MD   30 mL at 11/13/15 1716  . aspirin chewable tablet 81 mg  81 mg Oral Daily Loura HaltBenjamin Jared Ditty, MD   81 mg at 11/15/15 0935  . atorvastatin (LIPITOR) tablet 80 mg  80 mg Oral q1800 Loura HaltBenjamin Jared Ditty, MD   80 mg at 11/14/15 1659  . bisacodyl (DULCOLAX) suppository 10 mg  10 mg Rectal Daily PRN Coletta MemosKyle Chrislyn Seedorf, MD      . clonazePAM Scarlette Calico(KLONOPIN) tablet 1 mg  1 mg Oral QHS PRN Loura Halt Ditty, MD   1 mg at 11/14/15 2319  . docusate sodium (COLACE) capsule 100 mg  100 mg Oral BID Coletta Memos, MD   100 mg at 11/15/15 0935  . hydrALAZINE (APRESOLINE) injection 10-20 mg  10-20 mg Intravenous Q4H PRN Duayne Cal, NP   20 mg at 11/10/15 2132  . ipratropium-albuterol (DUONEB) 0.5-2.5 (3) MG/3ML nebulizer solution 3 mL  3 mL Nebulization Q4H PRN Roslynn Amble, MD      . labetalol (NORMODYNE,TRANDATE) injection 20 mg  20 mg Intravenous Q2H PRN Loura Halt Ditty, MD   20 mg at 11/11/15 0048  . levETIRAcetam (KEPPRA) tablet 500 mg  500 mg Oral BID Coletta Memos, MD   500 mg at 11/15/15 0935  . lisinopril (PRINIVIL,ZESTRIL) tablet 20 mg  20 mg Oral BID Loura Halt Ditty, MD   20 mg at 11/15/15 0935  . metoprolol tartrate (LOPRESSOR) tablet 25 mg  25 mg Oral BID  Loura Halt Ditty, MD   25 mg at 11/15/15 0935  . mometasone-formoterol (DULERA) 200-5 MCG/ACT inhaler 2 puff  2 puff Inhalation BID Duayne Cal, NP   2 puff at 11/15/15 [phone]  . nitroGLYCERIN (NITROSTAT) SL tablet 0.4 mg  0.4 mg Sublingual Q5 min PRN Loura Halt Ditty, MD      . ondansetron [facility]) tablet 4 mg  4 mg Oral Q6H PRN Coletta Memos, MD   4 mg at 11/13/15 1716   Or  . ondansetron (ZOFRAN) injection 4 mg  4 mg Intravenous Q6H PRN Coletta Memos, MD   4 mg at 11/10/15 2325  . oxyCODONE (Oxy IR/ROXICODONE) immediate release tablet 5 mg  5 mg Oral Q4H PRN Coletta Memos, MD   5 mg at 11/15/15 1113  . pantoprazole (PROTONIX) EC tablet 40 mg  40 mg Oral Daily Loura Halt Ditty, MD   40 mg at 11/15/15 0935  . polyethylene glycol (MIRALAX / GLYCOLAX) packet 17 g  17 g Oral Daily PRN Coletta Memos, MD   17 g at 11/11/15 0506  . potassium chloride SA (K-DUR,KLOR-CON) CR tablet 60 mEq  60 mEq Oral Daily Loura Halt Ditty, MD   60 mEq at 11/15/15 0935  . senna (SENOKOT) tablet 8.6 mg  1 tablet Oral BID Coletta Memos, MD   8.6 mg at 11/15/15 0935  . sodium chloride flush (NS) 0.9 % injection 3 mL  3 mL Intravenous Q12H Coletta Memos, MD   3 mL at 11/15/15 0937  . sodium chloride flush (NS) 0.9 % injection 3 mL  3 mL Intravenous PRN Coletta Memos, MD      . zolpidem (AMBIEN) tablet 5 mg  5 mg Oral QHS PRN Coletta Memos, MD   5 mg at 11/15/15 0001     Discharge Medications: Please see discharge summary for a list of discharge medications.  Relevant Imaging Results:  Relevant Lab Results:   Additional Information SSN: [id number] [place], [place]

## 2015-11-16 NOTE — Progress Notes (Signed)
CSW following and attempting to find ALF placement for pt due to MD concerns with mental status/ability to care for self at home  No LOG ALF beds available at this time- CSW discussed with patient who thinks she may feel strong enough to go home after the weekend- pt states no one can help her at DC- states her dtr is going through some personal issues and cannot/will not assist her at home.    CSW discussed with supervisor- pt can have HRI services where someone can visit or touch base with pt daily to check on how she is managing at home- CSW paged MD to discuss this option- on call MD returned call- Dr. Franky Machoabbell to make final decision regarding DC and will not be available over the weekend  CSW will continue to follow  Merlyn LotJenna Holoman, [facility] HospitalCSWA Clinical Social Worker [phone]

## 2015-11-16 NOTE — Progress Notes (Signed)
Physical Therapy Treatment Patient Details Name: Tresea Malleresa Berkheimer MRN: [id number] DOB: 1961/03/30 Today's Date: 11/16/2015    History of Present Illness Pt admitted on 11/07/15 with seizure activity and SAH. PMH which includes HTN, HLD, COPD, and Anxiety. Records from OSH also report PMH including MI, CAD s/p DES to LAD    PT Comments    Progressing steadily.  Less concerned about her general mobility and risk of falls as her ability to manage more complex cognitive tasks at home alone.  Follow Up Recommendations  SNF     Equipment Recommendations  None recommended by PT    Recommendations for Other Services       Precautions / Restrictions Precautions Precautions: Fall    Mobility  Bed Mobility Overal bed mobility: Modified Independent                Transfers Overall transfer level: Modified independent                  Ambulation/Gait Ambulation/Gait assistance: Modified independent (Device/Increase time) Ambulation Distance (Feet): 250 Feet Assistive device: None Gait Pattern/deviations: Step-through pattern Gait velocity: decreased   General Gait Details: still slow, and tentative gait, but steady and safe in this supervised environment   Stairs            Wheelchair Mobility    Modified Rankin (Stroke Patients Only) Modified Rankin (Stroke Patients Only) Modified Rankin: Moderate disability     Balance     Sitting balance-Leahy Scale: Good       Standing balance-Leahy Scale: Good                 High Level Balance Comments: balance challenges during gait produce guarding, but not deviation or LOB    Cognition Arousal/Alertness: Awake/alert Behavior During Therapy: WFL for tasks assessed/performed Overall Cognitive Status: Impaired/Different from baseline Area of Impairment: Following commands;Safety/judgement;Awareness;Problem solving   Current Attention Level: Alternating   Following Commands: Follows multi-step  commands with increased time;Follows multi-step commands inconsistently Safety/Judgement: Decreased awareness of deficits Awareness: Emergent Problem Solving: Slow processing;Difficulty sequencing General Comments: Thinking more quickly, but not at baseline    Exercises      General Comments        Pertinent Vitals/Pain Pain Assessment: No/denies pain    Home Living                      Prior Function            PT Goals (current goals can now be found in the care plan section) Acute Rehab PT Goals Patient Stated Goal: to go home PT Goal Formulation: With patient Potential to Achieve Goals: Good Progress towards PT goals: Progressing toward goals    Frequency  Min 3X/week    PT Plan Discharge plan needs to be updated    Co-evaluation             End of Session Equipment Utilized During Treatment: Gait belt Activity Tolerance: Patient tolerated treatment well;Patient limited by fatigue Patient left: in bed;with call bell/phone within reach     Time: 1122-1135 PT Time Calculation (min) (ACUTE ONLY): 13 min  Charges:  $Gait Training: 8-22 mins                    G Codes:      Coltrane Tugwell, Eliseo GumKenneth V 11/16/2015, 1:32 PM  11/16/2015  [place] BingKen Deziyah Arvin, PT [phone]  (pager)

## 2015-11-17 NOTE — Progress Notes (Signed)
Filed Vitals:   11/16/15 0604 11/16/15 0948 11/16/15 2143 11/17/15 0528  BP: 153/76 153/87 138/77 145/68  Pulse: 63 70 56 57  Temp: 98.4 F (36.9 C) 98.6 F (37 C) 98.3 F (36.8 C) 97.7 F (36.5 C)  TempSrc: Oral Oral Oral Oral  Resp:  20 18 18   Height:      Weight:      SpO2: 98% 95% 97% 99%    Patient resting in bed, some headache. Awake and alert, oriented to name, [facility] hospital, and July 2017. Following commands. Speech fluent. EOMI. Facial movements symmetrical. No drift of upper extremities.  Plan: Neurologically stable, continue supportive care.  Hewitt Shorts, MD 11/17/2015, 9:29 AM

## 2015-11-18 NOTE — Progress Notes (Signed)
No acute events Neuro exam unchanged Stable Dispo planning

## 2015-11-18 NOTE — Clinical Social Work Note (Signed)
CSW attempting to reach Dr. Bevely Palmer re: d/c plan. Patient discussed with SW Emergency planning/management officer. Unable to locate ALF bed for patient.  Patient has been approved for HRI (High Risk Initiative) program which would provide intensive home health follow up and further resource management.  Per nursing- patient is alert and oriented X's 4.  SW services will continue to monitor and assist as needed with d/c plans.  Lorri Frederick. Jaci Lazier, LCSW [phone]  (weekend coverage)

## 2015-11-19 NOTE — Progress Notes (Signed)
PT Cancellation Note  Patient Details Name: Joan Newman MRN: [id number] DOB: [birth year]   Cancelled Treatment:    Reason Eval/Treat Not Completed: Patient declined, no reason specified.  Pt reported a HA at 7/10 and deferred therapy this am and deferred because wanted to eat her supper this pm. 11/19/2015  Mayes Bing, PT [phone]  (pager)   Lary Eckardt, Eliseo Gum 11/19/2015, 5:36 PM

## 2015-11-19 NOTE — Progress Notes (Signed)
Occupational Therapy Treatment Patient Details Name: Joan Newman MRN: [id number] DOB: 03-05-1961 Today's Date: 11/19/2015    History of present illness Pt admitted on 11/07/15 with seizure activity and SAH. PMH which includes HTN, HLD, COPD, and Anxiety. Records from OSH also report PMH including MI, CAD s/p DES to LAD   OT comments  Pt making progress with functional goals. Pt mildly unsteady this session and required min guard assist for sit - stand from bed  to ambulate to bathroom HHA and with dynamic standing activities at sink. OT will continue to follow  Follow Up Recommendations  SNF;Supervision/Assistance - 24 hour;Home health OT    Equipment Recommendations  Tub/shower seat;Other (comment) Lexicographer)    Recommendations for Other Services      Precautions / Restrictions Precautions Precautions: Fall Restrictions Weight Bearing Restrictions: No       Mobility Bed Mobility Overal bed mobility: Modified Independent Bed Mobility: Supine to Sit;Sit to Supine     Supine to sit: Modified independent (Device/Increase time) Sit to supine: Modified independent (Device/Increase time)      Transfers Overall transfer level: Modified independent Equipment used: 1 person hand held assist Transfers: Sit to/from Stand Sit to Stand: Modified independent (Device/Increase time)         General transfer comment: mildly unsteady on initial standing abn during mobility to and from restrooom    Balance   Sitting-balance support: No upper extremity supported;Feet supported Sitting balance-Leahy Scale: Good     Standing balance support: During functional activity Standing balance-Leahy Scale: Fair                     ADL Overall ADL's : Needs assistance/impaired     Grooming: Min guard;Standing;Wash/dry hands;Wash/dry face   Upper Body Bathing: Set up;Sitting   Lower Body Bathing: Min guard;Sit to/from stand   Upper Body Dressing : Set up;Sitting        Toilet Transfer: Min guard;Regular Toilet;Grab bars;Supervision/safety   Toileting- Clothing Manipulation and Hygiene: Supervision/safety;Sit to/from stand   Tub/ Shower Transfer: 3 in 1;Grab bars;Ambulation;Min guard   Functional mobility during ADLs: Min guard;Supervision/safety General ADL Comments: pt states that hse has a walk in shower and tub shower at home and that she prefers baths. OT educated pt on showering safety using walk in shower with shower seat instead      Vision  Pt states that blurred vision has resolved                              Cognition   Behavior During Therapy: WFL for tasks assessed/performed Overall Cognitive Status: Within Functional Limits for tasks assessed                       Extremity/Trunk Assessment   WFL                        General Comments  pt very pleasant and cooperative    Pertinent Vitals/ Pain       Pain Assessment: 0-10 Pain Score: 7  Pain Location: headache Pain Descriptors / Indicators: Constant Pain Intervention(s): Monitored during session;Relaxation  Home Living  home alone                                        Prior Functioning/Environment  independent           Frequency Min 2X/week     Progress Toward Goals  OT Goals(current goals can now be found in the care plan section)  Progress towards OT goals: Progressing toward goals     Plan Discharge plan remains appropriate                     End of Session     Activity Tolerance Patient tolerated treatment well   Patient Left in bed;with call bell/phone within reach             Time: 1046-1106 OT Time Calculation (min): 20 min  Charges: OT General Charges $OT Visit: 1 Procedure OT Treatments $Self Care/Home Management : 8-22 mins  Galen Manila 11/19/2015, 1:49 PM

## 2015-11-19 NOTE — Progress Notes (Signed)
Patient ID: Joan Newman, female   DOB: 07/23/1960, 55 y.o.   MRN: [id number]  BP (!) 155/75 (BP Location: Left Arm)   Pulse (!) 59   Temp 97.6 F (36.4 C) (Oral)   Resp 20   Ht 5\' 3"  (1.6 m)   Wt 75.7 kg (166 lb 14.2 oz)   SpO2 98%   BMI 29.56 kg/m  No neurologic changes  Awaiting placement Complaining of headache.

## 2015-11-19 NOTE — Progress Notes (Addendum)
This therapist is aware of the difficult discharge with this patient.  As I have stated, mobility is at a modified independent to independent level.  I am not worried about risk of falling, but I feel  pt needs a supervised setting in the short term due to not being able to plan and execute potentially unsafe tasks well and is not able to even focus well on simpler tasks including following content on the television.   A cognitive evaluation would much more accurately evaluate her ability to do more complex tasks. 11/19/2015  Ravenna Bing, PT [phone]  (pager)

## 2015-11-20 NOTE — Progress Notes (Signed)
Patient ID: Joan Newman, female   DOB: 28-Jul-1960, 55 y.o.   MRN: [id number] BP (!) 147/56 (BP Location: Left Arm)   Pulse (!) 104   Temp 97.9 F (36.6 C) (Oral)   Resp 18   Ht 5\' 3"  (1.6 m)   Wt 75.7 kg (166 lb 14.2 oz)   SpO2 98%   BMI 29.56 kg/m  Alert and oriented x 4, speech is clear Moving all extremities well Complains of headache, states that klonopin is helping. Thinks her vision is not where it was, not as good.  Awaiting cognitive consultation.

## 2015-11-20 NOTE — Care Management Note (Signed)
Case Management Note  Patient Details  Name: Joan Newman MRN: [id number] Date of Birth: [birth year]  Subjective/Objective:                    Action/Plan: CM and CSW continuing to follow for placement.   Expected Discharge Date:   (Pending)               Expected Discharge Plan:  Home/Self Care  In-House Referral:     Discharge planning Services  CM Consult  Post Acute Care Choice:    Choice offered to:     DME Arranged:    DME Agency:     HH Arranged:    HH Agency:     Status of Service:  In process, will continue to follow  If discussed at Long Length of Stay Meetings, dates discussed:    Additional Comments:  Kermit Balo, RN 11/20/2015, 10:42 AM

## 2015-11-20 NOTE — Progress Notes (Signed)
Physical Therapy Treatment Patient Details Name: Joan Newman MRN: [id number] DOB: [birth year] Today's Date: 11/20/2015    History of Present Illness Pt admitted on 11/07/15 with seizure activity and SAH. PMH which includes HTN, HLD, COPD, and Anxiety. Records from OSH also report PMH including MI, CAD s/p DES to LAD    PT Comments    Pt is improving.  Needs a Cognitive assessment   Follow Up Recommendations  No PT follow up     Equipment Recommendations  None recommended by PT    Recommendations for Other Services Speech consult     Precautions / Restrictions Precautions Precautions: Fall    Mobility  Bed Mobility Overal bed mobility: Modified Independent Bed Mobility: Supine to Sit;Sit to Supine              Transfers Overall transfer level: Modified independent                  Ambulation/Gait Ambulation/Gait assistance: Modified independent (Device/Increase time) Ambulation Distance (Feet): 800 Feet Assistive device: None Gait Pattern/deviations: Step-through pattern   Gait velocity interpretation: Below normal speed for age/gender General Gait Details: slower, little variation in speed, occaisional mild unsteadiness., but seems to understand her mobility limitations.   Stairs            Wheelchair Mobility    Modified Rankin (Stroke Patients Only) Modified Rankin (Stroke Patients Only) Modified Rankin: Moderate disability     Balance Overall balance assessment: Needs assistance   Sitting balance-Leahy Scale: Normal       Standing balance-Leahy Scale: Good                      Cognition Arousal/Alertness: Awake/alert Behavior During Therapy: WFL for tasks assessed/performed Overall Cognitive Status: Within Functional Limits for tasks assessed     Current Attention Level: Alternating Memory: Decreased recall of precautions Following Commands: Follows multi-step commands inconsistently Safety/Judgement: Decreased  awareness of deficits Awareness: Emergent Problem Solving: Slow processing;Difficulty sequencing General Comments: pt does show improvement in ability to relate the safety issues that she does have and how she has gotten around some of these in the past.  I still think a cognitive assessment will be beneficial in determining how safe she will be alone.    Exercises      General Comments        Pertinent Vitals/Pain Pain Assessment: 0-10 Pain Score: 6  Pain Location: R foot/leg, HA Pain Descriptors / Indicators: Aching;Sore Pain Intervention(s): Premedicated before session    Home Living                      Prior Function            PT Goals (current goals can now be found in the care plan section) Progress towards PT goals: Progressing toward goals    Frequency  Min 3X/week    PT Plan Discharge plan needs to be updated    Co-evaluation             End of Session   Activity Tolerance: Patient tolerated treatment well Patient left: in bed;Other (comment) (sitting EOB)     Time: 6122-4497 PT Time Calculation (min) (ACUTE ONLY): 25 min  Charges:  $Gait Training: 8-22 mins $Therapeutic Activity: 8-22 mins                    G Codes:      Joan Newman, Eliseo Gum 11/20/2015, 1:28 PM  11/20/2015  Donnella Sham, PT [phone]  (pager)

## 2015-11-21 ENCOUNTER — Inpatient Hospital Stay (HOSPITAL_COMMUNITY): Payer: Self-pay

## 2015-11-21 MED ORDER — CLOTRIMAZOLE 1 % VA CREA
1.0000 | TOPICAL_CREAM | Freq: Every day | VAGINAL | Status: DC
Start: 2015-11-21 — End: 2015-11-23
  Administered 2015-11-21: 1 via VAGINAL
  Filled 2015-11-21: qty 45

## 2015-11-21 NOTE — Progress Notes (Signed)
Patient ID: Joan Newman, female   DOB: Feb 16, 1961, 55 y.o.   MRN: [id number] BP 115/69 (BP Location: Left Arm)   Pulse (!) 54   Temp 98.4 F (36.9 C) (Oral)   Resp 16   Ht 5\' 3"  (1.6 m)   Wt 75.7 kg (166 lb 14.2 oz)   SpO2 96%   BMI 29.56 kg/m  Head ct shows much improvement. No identifiable reason for left visual field deficits.  Alert follows commands Moving all extremities well.  Speech note appreciated, will need continuous supervision at this time.

## 2015-11-21 NOTE — Evaluation (Signed)
Speech Language Pathology Evaluation Patient Details Name: Joan Newman MRN: [id number] DOB: 26-Feb-1961 Today's Date: 11/21/2015 Time: 5188-4166 SLP Time Calculation (min) (ACUTE ONLY): 45 min  Problem List:  Patient Active Problem List   Diagnosis Date Noted  . PRES (posterior reversible encephalopathy syndrome) 11/08/2015  . Chronic obstructive pulmonary disease (HCC)   . Essential hypertension   . Tick bite    Past Medical History: History reviewed. No pertinent past medical history. Past Surgical History: History reviewed. No pertinent surgical history. HPI:  55 yr old found minimally responsive this morning with observed seizures Head ct showed a small amount of left frontal subarachnoid blood, not suggestive an aneurysmal hemorrhage but of PRES. No prior PMH documented.   Assessment / Plan / Recommendation Clinical Impression  Pt was administered the Cognistat neurbehavioral Cognitive Status Exam. She scored in the moderate-severe impairment range for memory, and mild impairment for attention and repitition. Of greatest concern is pt's visual disturbances and difficulty visualizing words on the left side of visual field posing difficulty taking correct medication dose/reading pill bottles. She has history of anxiety and exhibited mild deficts attending to verbal information. Pt's anxiety may affect performance in situations at home with a higher cognitive demand. Pt's awareness of deficits appears adequate and she has initiated attempts to pensate for memory challenges. From a safety standpoint at present pt needs 24 hour supervision with home health Speech Pathology services for memory, compensation for visual disturbance, attention and executive functioning.        SLP Assessment  Patient needs continued Speech Lanaguage Pathology Services    Follow Up Recommendations  24 hour supervision/assistance (home health at ALF)    Frequency and Duration min 2x/week  2 weeks       SLP Evaluation Prior Functioning  Cognitive/Linguistic Baseline: Within functional limits Type of Home: Mobile home  Lives With: Alone Education:  (one year college) Vocation: Unemployed (LPN)   Cognition  Overall Cognitive Status: Impaired/Different from baseline Arousal/Alertness: Awake/alert Orientation Level: Oriented X4 Attention: Sustained Sustained Attention: Impaired Sustained Attention Impairment: Verbal basic;Functional basic Memory: Impaired Memory Impairment: Storage deficit;Retrieval deficit (mod-severe range testing, recalled possible disposition plan) Awareness: Appears intact Problem Solving: Appears intact Executive Function: Landscape architect: Impaired Safety/Judgment: Impaired    Comprehension  Auditory Comprehension Overall Auditory Comprehension: Appears within functional limits for tasks assessed Yes/No Questions: Not tested Commands: Impaired Multistep Basic Commands: 50-74% accurate Interfering Components: Attention;Anxiety;Working Theatre manager: Not tested Reading Comprehension Reading Status: Impaired Paragraph Level: Impaired Interfering Components: Attention;Visual perceptual Effective Techniques:  (finger scanning)    Expression Expression Primary Mode of Expression: Verbal Verbal Expression Overall Verbal Expression: Appears within functional limits for tasks assessed Initiation: No impairment Level of Generative/Spontaneous Verbalization: Conversation Repetition: Impaired Level of Impairment: Sentence level Naming: No impairment Pragmatics: No impairment Interfering Components: Attention Written Expression Dominant Hand: Right Written Expression: Not tested   Oral / Motor  Oral Motor/Sensory Function Overall Oral Motor/Sensory Function: Within functional limits Motor Speech Overall Motor Speech: Appears within functional limits for tasks assessed Respiration: Within functional  limits Phonation: Normal Resonance: Within functional limits Articulation: Within functional limitis Intelligibility: Intelligible Motor Planning: Witnin functional limits   GO                    Royce Macadamia 11/21/2015, 10:46 AM  Breck Coons Lonell Face.Ed [employer] [phone]

## 2015-11-21 NOTE — Progress Notes (Signed)
Occupational Therapy Treatment Patient Details Name: Joan Newman MRN: [id number] DOB: Oct 28, 1960 Today's Date: 11/21/2015    History of present illness Pt admitted on 11/07/15 with seizure activity and SAH. PMH which includes HTN, HLD, COPD, and Anxiety. Records from OSH also report PMH including MI, CAD s/p DES to LAD   OT comments  Pt able to perform higher level cognitive activities today with min verbal cues for sequencing. Pt more aware of memory and safety deficits today but continue to feel pt requires increased level of supervision than can be provided at home. Pt c/o visual deficits, however, difficult to assess since pt does not have glasses present in room; encouraged pt to ask family to bring in glasses for further visual evaluation. Updated d/c recommendations to [facility] at ALF for follow up in order to maximize independence and safety with ADL and mobility prior to return home alone. Will continue to follow acutely.    Follow Up Recommendations  Supervision/Assistance - 24 hour;Home health OT (HHOT at ALF)    Equipment Recommendations  Tub/shower bench    Recommendations for Other Services      Precautions / Restrictions Restrictions Weight Bearing Restrictions: No       Mobility Bed Mobility Overal bed mobility: Modified Independent                Transfers Overall transfer level: Modified independent                    Balance Overall balance assessment: No apparent balance deficits (not formally assessed)                                 ADL Overall ADL's : Needs assistance/impaired                                       General ADL Comments: Pt able to perform higher level cognitive tasks with min verbal cues. Pt initially with difficulty remembering path finding task but able to follow map to get to location specified by therapist. Pt with better ability to path find back to room without verbal cues using strategies  taught by therapist (using signs).       Vision                 Additional Comments: Pt reports blurred vision on L side. With visual testing, it seems that pt is having blurred vision in her peripherals. Pt without glasses present in room, therefore, she states at times that everything is blurry. Encouraged pt to call family to bring in glasses so further assessment can be performed.   Perception     Praxis      Cognition   Behavior During Therapy: WFL for tasks assessed/performed Overall Cognitive Status: Impaired/Different from baseline Area of Impairment: Following commands;Safety/judgement;Awareness;Problem solving        Following Commands: Follows multi-step commands inconsistently;Follows one step commands consistently Safety/Judgement: Decreased awareness of deficits (pt more aware of memory deficits) Awareness: Emergent Problem Solving: Slow processing General Comments: Pt with improved cognition and memory however continues to require verbal cues for higher level cognitive tasks    Extremity/Trunk Assessment               Exercises     Shoulder Instructions       General Comments  Pertinent Vitals/ Pain       Pain Assessment: Faces Faces Pain Scale: Hurts a little bit Pain Location: head, R foot during mobility Pain Descriptors / Indicators: Headache Pain Intervention(s): Monitored during session;Patient requesting pain meds-RN notified  Home Living                                          Prior Functioning/Environment              Frequency Min 2X/week     Progress Toward Goals  OT Goals(current goals can now be found in the care plan section)  Progress towards OT goals: Progressing toward goals  Acute Rehab OT Goals Patient Stated Goal: decrease pain OT Goal Formulation: With patient  Plan Discharge plan needs to be updated;Equipment recommendations need to be updated    Co-evaluation                  End of Session     Activity Tolerance Patient tolerated treatment well   Patient Left in bed;with call bell/phone within reach   Nurse Communication          Time: 4540-9811 OT Time Calculation (min): 13 min  Charges: OT General Charges $OT Visit: 1 Procedure OT Treatments $Therapeutic Activity: 8-22 mins  Gaye Alken M.S., OTR/L Pager: [phone]  11/21/2015, 3:58 PM

## 2015-11-22 NOTE — Progress Notes (Signed)
Patient has been accepted with an LOG at [facility]. Patient can discharge Friday (hoping to set her up on a transport van coming at 11am).  Osborne Casco Paisly [place]ut LCSWA [phone]

## 2015-11-22 NOTE — Progress Notes (Signed)
Physical Therapy Treatment Patient Details Name: Joan Newman MRN: [id number] DOB: [birth year] Today's Date: 11/22/2015    History of Present Illness Pt admitted on 11/07/15 with seizure activity and SAH. PMH which includes HTN, HLD, COPD, and Anxiety. Records from OSH also report PMH including MI, CAD s/p DES to LAD    PT Comments    Patient making progress with mobility and gait.  Recommend completing balance assessment at next visit.  Follow Up Recommendations  No PT follow up     Equipment Recommendations  None recommended by PT    Recommendations for Other Services       Precautions / Restrictions Precautions Precautions: None Precaution Comments: Patient up in her room to bathroom independently Restrictions Weight Bearing Restrictions: No    Mobility  Bed Mobility Overal bed mobility: Independent                Transfers Overall transfer level: Independent Equipment used: None                Ambulation/Gait Ambulation/Gait assistance: Supervision Ambulation Distance (Feet): 300 Feet Assistive device: None Gait Pattern/deviations: Step-through pattern;Decreased stride length Gait velocity: decreased Gait velocity interpretation: Below normal speed for age/gender General Gait Details: Patient with slow, guarded gait.  No loss of balance noted.   Stairs Stairs: Yes Stairs assistance: Min guard Stair Management: One rail Right;Alternating pattern;Step to pattern;Forwards Number of Stairs: 4 General stair comments: Min guard for safety.  Patient c/o Rt shin pain.  Instructed on step-to pattern to descend stairs.  Wheelchair Mobility    Modified Rankin (Stroke Patients Only) Modified Rankin (Stroke Patients Only) Pre-Morbid Rankin Score: No symptoms Modified Rankin: Moderate disability     Balance                                    Cognition Arousal/Alertness: Awake/alert Behavior During Therapy: WFL for tasks  assessed/performed Overall Cognitive Status: Impaired/Different from baseline Area of Impairment: Problem solving             Problem Solving: Slow processing      Exercises      General Comments General comments (skin integrity, edema, etc.): Eyes red and watering.  RN notified      Pertinent Vitals/Pain Pain Assessment: 0-10 Pain Score: 6  Pain Location: Head and Rt shin area Pain Descriptors / Indicators: Headache;Aching;Sore Pain Intervention(s): Monitored during session;Repositioned;Patient requesting pain meds-RN notified    Home Living                      Prior Function            PT Goals (current goals can now be found in the care plan section) Acute Rehab PT Goals Patient Stated Goal: decrease pain Progress towards PT goals: Progressing toward goals    Frequency  Min 3X/week    PT Plan Current plan remains appropriate    Co-evaluation             End of Session Equipment Utilized During Treatment: Gait belt Activity Tolerance: Patient tolerated treatment well Patient left: in bed;with call bell/phone within reach (sitting EOB)     Time: 3568-6168 PT Time Calculation (min) (ACUTE ONLY): 13 min  Charges:  $Gait Training: 8-22 mins                    G CodesEarlene Plater,  Durenda Hurt 11/22/2015, 2:18 PM  Durenda Hurt Renaldo Fiddler, [facility] Acute Rehab Services Pager [phone]

## 2015-11-22 NOTE — Progress Notes (Signed)
Pt complaining of eyes excessively watery I do not see any current sign of this occurring but will continue to monitor.

## 2015-11-22 NOTE — Progress Notes (Signed)
PASRR: [id number] [place] [place] Arnita Koons LCSWA [phone]

## 2015-11-22 NOTE — NC FL2 (Addendum)
[place] MEDICAID FL2 LEVEL OF CARE SCREENING TOOL     IDENTIFICATION  Patient Name: Joan Newman Birthdate: 01/28/1961 Sex: female Admission Date (Current Location): 11/07/2015  [facility] and [place] Number:  Producer, television/film/video and Address:  [facility], [address], [place], [place] [address]      Provider Number: [id number]  Attending Physician Name and Address:  Coletta Memos, MD  Relative Name and Phone Number:  Marchelle Folks, daughter, [phone]    Current Level of Care: Hospital Recommended Level of Care: Skilled Nursing Facility Prior Approval Number:    Date Approved/Denied:   PASRR Number: [id number]  Discharge Plan: SNF    Current Diagnoses: Patient Active Problem List   Diagnosis Date Noted  . PRES (posterior reversible encephalopathy syndrome) 11/08/2015  . Chronic obstructive pulmonary disease (HCC)   . Essential hypertension   . Tick bite     Orientation RESPIRATION BLADDER Height & Weight     Self, Time, Situation, Place  Normal Continent Weight: 75.7 kg (166 lb 14.2 oz) Height:  5\' 3"  (160 cm)  BEHAVIORAL SYMPTOMS/MOOD NEUROLOGICAL BOWEL NUTRITION STATUS    Convulsions/Seizures Continent Diet  AMBULATORY STATUS COMMUNICATION OF NEEDS Skin   Supervision Verbally Normal                       Personal Care Assistance Level of Assistance  Bathing, Feeding, Dressing Bathing Assistance: Independent Feeding assistance: Independent Dressing Assistance: Independent     Functional Limitations Info             SPECIAL CARE FACTORS FREQUENCY  PT (By licensed PT), OT (By licensed OT)     PT Frequency: min 3x/week OT Frequency: min 2x/week            Contractures Contractures Info: Not present    Additional Factors Info  Code Status, Allergies Code Status Info: Full Allergies Info: NKA           Current Medications (11/22/2015):  This is the current hospital active medication list Current  Facility-Administered Medications  Medication Dose Route Frequency Provider Last Rate Last Dose  . 0.9 %  sodium chloride infusion  250 mL Intravenous PRN Coletta Memos, MD      . acetaminophen (TYLENOL) tablet 650 mg  650 mg Oral Q6H PRN Coletta Memos, MD   650 mg at 11/21/15 0535   Or  . acetaminophen (TYLENOL) suppository 650 mg  650 mg Rectal Q6H PRN Coletta Memos, MD      . diphenhydrAMINE (BENADRYL) capsule 25 mg  25 mg Oral QHS PRN Coletta Memos, MD       And  . acetaminophen (TYLENOL) tablet 500 mg  500 mg Oral QHS PRN Coletta Memos, MD      . albuterol (PROVENTIL) (2.5 MG/3ML) 0.083% nebulizer solution 3 mL  3 mL Inhalation Q4H PRN Loura Halt Ditty, MD      . alum & mag hydroxide-simeth (MAALOX/MYLANTA) 200-200-20 MG/5ML suspension 30 mL  30 mL Oral Q6H PRN Coletta Memos, MD   30 mL at 11/13/15 1716  . aspirin chewable tablet 81 mg  81 mg Oral Daily Loura Halt Ditty, MD   81 mg at 11/22/15 8338  . atorvastatin (LIPITOR) tablet 80 mg  80 mg Oral q1800 Loura Halt Ditty, MD   80 mg at 11/21/15 1841  . bisacodyl (DULCOLAX) suppository 10 mg  10 mg Rectal Daily PRN Coletta Memos, MD      . clonazePAM Scarlette Calico) tablet 1  mg  1 mg Oral TID PRN Coletta Memos, MD   1 mg at 11/22/15 1443  . clotrimazole (GYNE-LOTRIMIN) vaginal cream 1 Applicatorful  1 Applicatorful Vaginal QHS Coletta Memos, MD   1 Applicatorful at 11/21/15 2300  . docusate sodium (COLACE) capsule 100 mg  100 mg Oral BID Coletta Memos, MD   100 mg at 11/22/15 0920  . hydrALAZINE (APRESOLINE) injection 10-20 mg  10-20 mg Intravenous Q4H PRN Duayne Cal, NP   20 mg at 11/10/15 2132  . ipratropium-albuterol (DUONEB) 0.5-2.5 (3) MG/3ML nebulizer solution 3 mL  3 mL Nebulization Q4H PRN Roslynn Amble, MD      . labetalol (NORMODYNE,TRANDATE) injection 20 mg  20 mg Intravenous Q2H PRN Loura Halt Ditty, MD   20 mg at 11/11/15 0048  . levETIRAcetam (KEPPRA) tablet 500 mg  500 mg Oral BID Coletta Memos, MD   500 mg at 11/22/15  0920  . lisinopril (PRINIVIL,ZESTRIL) tablet 20 mg  20 mg Oral BID Loura Halt Ditty, MD   20 mg at 11/22/15 1610  . metoprolol tartrate (LOPRESSOR) tablet 25 mg  25 mg Oral BID Loura Halt Ditty, MD   25 mg at 11/22/15 0920  . mometasone-formoterol (DULERA) 200-5 MCG/ACT inhaler 2 puff  2 puff Inhalation BID Duayne Cal, NP   2 puff at 11/22/15 [phone]  . nitroGLYCERIN (NITROSTAT) SL tablet 0.4 mg  0.4 mg Sublingual Q5 min PRN Loura Halt Ditty, MD      . ondansetron [facility]) tablet 4 mg  4 mg Oral Q6H PRN Coletta Memos, MD   4 mg at 11/13/15 1716   Or  . ondansetron (ZOFRAN) injection 4 mg  4 mg Intravenous Q6H PRN Coletta Memos, MD   4 mg at 11/10/15 2325  . oxyCODONE (Oxy IR/ROXICODONE) immediate release tablet 5 mg  5 mg Oral Q4H PRN Coletta Memos, MD   5 mg at 11/22/15 1443  . pantoprazole (PROTONIX) EC tablet 40 mg  40 mg Oral Daily Loura Halt Ditty, MD   40 mg at 11/22/15 5409  . polyethylene glycol (MIRALAX / GLYCOLAX) packet 17 g  17 g Oral Daily PRN Coletta Memos, MD   17 g at 11/11/15 0506  . potassium chloride SA (K-DUR,KLOR-CON) CR tablet 60 mEq  60 mEq Oral Daily Loura Halt Ditty, MD   60 mEq at 11/22/15 0920  . senna (SENOKOT) tablet 8.6 mg  1 tablet Oral BID Coletta Memos, MD   8.6 mg at 11/22/15 0920  . sodium chloride flush (NS) 0.9 % injection 3 mL  3 mL Intravenous Q12H Coletta Memos, MD   3 mL at 11/19/15 2200  . sodium chloride flush (NS) 0.9 % injection 3 mL  3 mL Intravenous PRN Coletta Memos, MD      . zolpidem (AMBIEN) tablet 5 mg  5 mg Oral QHS PRN Coletta Memos, MD   5 mg at 11/22/15 0109     Discharge Medications: Please see discharge summary for a list of discharge medications.  Relevant Imaging Results:  Relevant Lab Results:   Additional Information SSN: [id number] [place], [place]

## 2015-11-23 ENCOUNTER — Encounter (HOSPITAL_COMMUNITY): Payer: Self-pay | Admitting: Emergency Medicine

## 2015-11-23 ENCOUNTER — Emergency Department (HOSPITAL_COMMUNITY): Payer: Self-pay

## 2015-11-23 ENCOUNTER — Inpatient Hospital Stay (HOSPITAL_COMMUNITY)
Admission: EM | Admit: 2015-11-23 | Discharge: 2015-11-27 | DRG: 072 | Disposition: A | Discharge status: Home / Self Care | Payer: Self-pay | Attending: Internal Medicine | Admitting: Internal Medicine

## 2015-11-23 DIAGNOSIS — I272 Other secondary pulmonary hypertension: Secondary | ICD-10-CM | POA: Diagnosis present

## 2015-11-23 DIAGNOSIS — R079 Chest pain, unspecified: Secondary | ICD-10-CM

## 2015-11-23 DIAGNOSIS — I2510 Atherosclerotic heart disease of native coronary artery without angina pectoris: Secondary | ICD-10-CM | POA: Diagnosis present

## 2015-11-23 DIAGNOSIS — R519 Headache, unspecified: Secondary | ICD-10-CM

## 2015-11-23 DIAGNOSIS — J441 Chronic obstructive pulmonary disease with (acute) exacerbation: Secondary | ICD-10-CM | POA: Diagnosis present

## 2015-11-23 DIAGNOSIS — K224 Dyskinesia of esophagus: Secondary | ICD-10-CM | POA: Diagnosis present

## 2015-11-23 DIAGNOSIS — Z7951 Long term (current) use of inhaled steroids: Secondary | ICD-10-CM

## 2015-11-23 DIAGNOSIS — I609 Nontraumatic subarachnoid hemorrhage, unspecified: Secondary | ICD-10-CM

## 2015-11-23 DIAGNOSIS — I10 Essential (primary) hypertension: Secondary | ICD-10-CM | POA: Diagnosis present

## 2015-11-23 DIAGNOSIS — H109 Unspecified conjunctivitis: Secondary | ICD-10-CM | POA: Diagnosis present

## 2015-11-23 DIAGNOSIS — K219 Gastro-esophageal reflux disease without esophagitis: Secondary | ICD-10-CM | POA: Diagnosis present

## 2015-11-23 DIAGNOSIS — F17200 Nicotine dependence, unspecified, uncomplicated: Secondary | ICD-10-CM | POA: Diagnosis present

## 2015-11-23 DIAGNOSIS — F329 Major depressive disorder, single episode, unspecified: Secondary | ICD-10-CM | POA: Diagnosis present

## 2015-11-23 DIAGNOSIS — R51 Headache: Secondary | ICD-10-CM

## 2015-11-23 DIAGNOSIS — E785 Hyperlipidemia, unspecified: Secondary | ICD-10-CM | POA: Diagnosis present

## 2015-11-23 DIAGNOSIS — F419 Anxiety disorder, unspecified: Secondary | ICD-10-CM | POA: Diagnosis present

## 2015-11-23 DIAGNOSIS — I252 Old myocardial infarction: Secondary | ICD-10-CM

## 2015-11-23 DIAGNOSIS — I6783 Posterior reversible encephalopathy syndrome: Principal | ICD-10-CM | POA: Diagnosis present

## 2015-11-23 DIAGNOSIS — J449 Chronic obstructive pulmonary disease, unspecified: Secondary | ICD-10-CM | POA: Diagnosis present

## 2015-11-23 DIAGNOSIS — F32A Depression, unspecified: Secondary | ICD-10-CM | POA: Diagnosis present

## 2015-11-23 DIAGNOSIS — R569 Unspecified convulsions: Secondary | ICD-10-CM | POA: Diagnosis present

## 2015-11-23 DIAGNOSIS — Z7982 Long term (current) use of aspirin: Secondary | ICD-10-CM

## 2015-11-23 DIAGNOSIS — F17210 Nicotine dependence, cigarettes, uncomplicated: Secondary | ICD-10-CM | POA: Diagnosis present

## 2015-11-23 HISTORY — DX: Major depressive disorder, single episode, unspecified: F32.9

## 2015-11-23 HISTORY — DX: Cerebral infarction, unspecified: I63.9

## 2015-11-23 HISTORY — DX: Essential (primary) hypertension: I10

## 2015-11-23 HISTORY — DX: Depression, unspecified: F32.A

## 2015-11-23 HISTORY — DX: Anxiety disorder, unspecified: F41.9

## 2015-11-23 HISTORY — DX: Atherosclerotic heart disease of native coronary artery without angina pectoris: I25.10

## 2015-11-23 HISTORY — DX: Unspecified convulsions: R56.9

## 2015-11-23 LAB — BASIC METABOLIC PANEL
Anion gap: 10 (ref 5–15)
BUN: 8 mg/dL (ref 6–20)
CO2: 27 mmol/L (ref 22–32)
CREATININE: 0.57 mg/dL (ref 0.44–1.00)
Calcium: 9.3 mg/dL (ref 8.9–10.3)
Chloride: 101 mmol/L (ref 101–111)
GFR calc Af Amer: >60 mL/min (ref >60)
GFR calc non Af Amer: >60 mL/min (ref >60)
GLUCOSE: 128 mg/dL — AB (ref 65–99)
POTASSIUM: 4.3 mmol/L (ref 3.5–5.1)
Sodium: 138 mmol/L (ref 135–145)

## 2015-11-23 LAB — CBC
HEMATOCRIT: 44 % (ref 36.0–46.0)
Hemoglobin: 14.4 g/dL (ref 12.0–15.0)
MCH: 32.7 pg (ref 26.0–34.0)
MCHC: 32.7 g/dL (ref 30.0–36.0)
MCV: 100 fL (ref 78.0–100.0)
Platelets: 282 10*3/uL (ref 150–400)
RBC: 4.4 MIL/uL (ref 3.87–5.11)
RDW: 13.3 % (ref 11.5–15.5)
WBC: 12 10*3/uL — ABNORMAL HIGH (ref 4.0–10.5)

## 2015-11-23 LAB — I-STAT TROPONIN, ED
TROPONIN I, POC: 0 ng/mL (ref 0.00–0.08)
Troponin i, poc: 0 ng/mL (ref 0.00–0.08)

## 2015-11-23 LAB — TROPONIN I: Troponin I: <0.03 ng/mL (ref <0.03)

## 2015-11-23 MED ORDER — LEVETIRACETAM 500 MG PO TABS
500.0000 mg | ORAL_TABLET | Freq: Two times a day (BID) | ORAL | Status: DC
  Administered 2015-11-23 – 2015-11-27 (×8): 500 mg via ORAL
  Filled 2015-11-23 (×8): qty 1

## 2015-11-23 MED ORDER — METOPROLOL TARTRATE 25 MG PO TABS
25.0000 mg | ORAL_TABLET | Freq: Two times a day (BID) | ORAL | Status: DC
  Administered 2015-11-23 – 2015-11-27 (×8): 25 mg via ORAL
  Filled 2015-11-23 (×8): qty 1

## 2015-11-23 MED ORDER — SODIUM CHLORIDE 0.9% FLUSH: 3.0000 mL | INTRAVENOUS | Status: DC | PRN

## 2015-11-23 MED ORDER — ACETAMINOPHEN 325 MG PO TABS: 650.0000 mg | ORAL_TABLET | ORAL | Status: DC | PRN

## 2015-11-23 MED ORDER — ACETAMINOPHEN 500 MG PO TABS
1000.0000 mg | ORAL_TABLET | Freq: Once | ORAL | Status: AC
  Administered 2015-11-23: 1000 mg via ORAL
  Filled 2015-11-23: qty 2

## 2015-11-23 MED ORDER — ONDANSETRON HCL 4 MG/2ML IJ SOLN: 4.0000 mg | Freq: Four times a day (QID) | INTRAMUSCULAR | Status: DC | PRN

## 2015-11-23 MED ORDER — TRAMADOL HCL 50 MG PO TABS: 50.0000 mg | ORAL_TABLET | Freq: Four times a day (QID) | ORAL | 0 refills | Status: DC | PRN

## 2015-11-23 MED ORDER — MOMETASONE FURO-FORMOTEROL FUM 200-5 MCG/ACT IN AERO
2.0000 | INHALATION_SPRAY | Freq: Two times a day (BID) | RESPIRATORY_TRACT | Status: DC
  Administered 2015-11-24 – 2015-11-27 (×7): 2 via RESPIRATORY_TRACT
  Filled 2015-11-23 (×2): qty 8.8

## 2015-11-23 MED ORDER — LISINOPRIL 20 MG PO TABS
20.0000 mg | ORAL_TABLET | Freq: Two times a day (BID) | ORAL | Status: DC
  Administered 2015-11-23 – 2015-11-27 (×8): 20 mg via ORAL
  Filled 2015-11-23 (×8): qty 1

## 2015-11-23 MED ORDER — TRAMADOL HCL 50 MG PO TABS
50.0000 mg | ORAL_TABLET | Freq: Four times a day (QID) | ORAL | Status: DC | PRN
  Administered 2015-11-24 – 2015-11-25 (×3): 50 mg via ORAL
  Filled 2015-11-23 (×3): qty 1

## 2015-11-23 MED ORDER — ALBUTEROL SULFATE (2.5 MG/3ML) 0.083% IN NEBU: 2.5000 mg | INHALATION_SOLUTION | RESPIRATORY_TRACT | Status: DC | PRN

## 2015-11-23 MED ORDER — NITROGLYCERIN 2 % TD OINT
0.5000 [in_us] | TOPICAL_OINTMENT | Freq: Once | TRANSDERMAL | Status: AC
  Administered 2015-11-23: 0.5 [in_us] via TOPICAL
  Filled 2015-11-23: qty 1

## 2015-11-23 MED ORDER — POTASSIUM CHLORIDE CRYS ER 20 MEQ PO TBCR
60.0000 meq | EXTENDED_RELEASE_TABLET | Freq: Every day | ORAL | Status: DC
  Administered 2015-11-24 – 2015-11-27 (×5): 60 meq via ORAL
  Filled 2015-11-23 (×5): qty 3

## 2015-11-23 MED ORDER — PANTOPRAZOLE SODIUM 40 MG PO TBEC
40.0000 mg | DELAYED_RELEASE_TABLET | Freq: Every day | ORAL | Status: DC
  Administered 2015-11-24 (×2): 40 mg via ORAL
  Filled 2015-11-23 (×2): qty 1

## 2015-11-23 MED ORDER — ATORVASTATIN CALCIUM 80 MG PO TABS
80.0000 mg | ORAL_TABLET | Freq: Every evening | ORAL | Status: DC
  Administered 2015-11-23 – 2015-11-26 (×4): 80 mg via ORAL
  Filled 2015-11-23 (×4): qty 1

## 2015-11-23 MED ORDER — CLONAZEPAM 0.5 MG PO TABS
0.5000 mg | ORAL_TABLET | Freq: Three times a day (TID) | ORAL | Status: DC | PRN
  Administered 2015-11-23 – 2015-11-25 (×5): 0.5 mg via ORAL
  Filled 2015-11-23 (×5): qty 1

## 2015-11-23 MED ORDER — GENTAMICIN SULFATE 0.1 % EX OINT
TOPICAL_OINTMENT | Freq: Three times a day (TID) | CUTANEOUS | Status: DC
  Administered 2015-11-23 – 2015-11-26 (×9): via TOPICAL
  Filled 2015-11-23 (×2): qty 15

## 2015-11-23 MED ORDER — ZOLPIDEM TARTRATE 5 MG PO TABS
5.0000 mg | ORAL_TABLET | Freq: Every evening | ORAL | Status: DC | PRN
  Administered 2015-11-23 – 2015-11-26 (×4): 5 mg via ORAL
  Filled 2015-11-23 (×4): qty 1

## 2015-11-23 MED ORDER — LEVETIRACETAM 500 MG PO TABS: 500.0000 mg | ORAL_TABLET | Freq: Two times a day (BID) | ORAL | 0 refills | Status: DC

## 2015-11-23 MED ORDER — SODIUM CHLORIDE 0.9% FLUSH
3.0000 mL | Freq: Two times a day (BID) | INTRAVENOUS | Status: DC
  Administered 2015-11-23 – 2015-11-27 (×8): 3 mL via INTRAVENOUS

## 2015-11-23 MED ORDER — MORPHINE SULFATE (PF) 4 MG/ML IV SOLN
4.0000 mg | Freq: Once | INTRAVENOUS | Status: AC
  Administered 2015-11-23: 4 mg via INTRAVENOUS
  Filled 2015-11-23: qty 1

## 2015-11-23 MED ORDER — ASPIRIN 81 MG PO CHEW
81.0000 mg | CHEWABLE_TABLET | Freq: Every day | ORAL | Status: DC
  Administered 2015-11-23 – 2015-11-27 (×5): 81 mg via ORAL
  Filled 2015-11-23 (×5): qty 1

## 2015-11-23 MED ORDER — SODIUM CHLORIDE 0.9 % IV SOLN: 250.0000 mL | INTRAVENOUS | Status: DC | PRN

## 2015-11-23 MED ORDER — NITROGLYCERIN 0.4 MG SL SUBL
0.4000 mg | SUBLINGUAL_TABLET | SUBLINGUAL | Status: DC | PRN
  Administered 2015-11-23: 0.4 mg via SUBLINGUAL
  Filled 2015-11-23: qty 1

## 2015-11-23 NOTE — ED Notes (Addendum)
Dr Ortiz at bedside 
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
## 2015-11-23 NOTE — Progress Notes (Signed)
Pt now discharging to [facility] at 12pm today.  Osborne Casco Wagner Tanzi LCSWA [phone]

## 2015-11-23 NOTE — H&P (Signed)
History and Physical    Joan Newman XBJ:[id number] DOB: 1961-04-03 DOA: 11/23/2015  PCP: Lonie Peak, PA-C   Patient coming from:   Chief Complaint: Chest pain.  HPI: Joan Newman is a 55 y.o. female with medical history significant of anxiety, depression, coronary artery disease,  NSTEMI in 2011 s/p DES to LAD, hypertension,  COPD, tobacco use disorder who was discharged from [facility] to [facility] care following an admission for seizures and subarachnoid hemorrhage on 11/07/2015.  Per patient, around  1300, 1 1/2 hours after being discharged from [facility] and arriving to the [facility] care facility, she experienced precordial chest pain while sitting in bed, stabbing like, radiating to her left shoulder and back associated with dyspnea, mild diaphoresis, nausea and palpitations. The patient rated her pain at 10/10 at that point. She denies pitting edema of the lower extremities, orthopnea or PND.  EMS was subsequently called. She received 324 mg of aspirin and sublingual nitroglycerin which decreased the pain to about 7/10. She was started on nitro paste in the emergency department which has decreased the discomfort further, but still complains of some chest pain. However she states that she has a significant headache now (has been having headache previously, but it seems that nitroglycerin has worsened it).  She denies fever, chills, abdominal pain, diarrhea, constipation, dysuria or hematuria. . She states that last month she was admitted for chest pain at [facility] and underwent a Myoview stress test, which apparently was negative. However, I am unable to see those results on the care everywhere tab as its assigned form shows her discharge summary instead.   ED Course: The patient was given on Nitropaste reporting partial relief. Workup shows EKG with inverted T waves on anterolateral leads, mild leukocytosis of 12 K, and hyperglycemia 128 mg/dL.   Review of Systems: As per HPI  otherwise 10 point review of systems negative.    Past Medical History:  Diagnosis Date  . Anxiety   . Coronary artery disease   . Depression   . Hypertension   . Seizures (HCC)   . Stroke [facility])     Past Surgical History:  Procedure Laterality Date  . CESAREAN SECTION       reports that she has been smoking Cigarettes.  She has been smoking about 2.00 packs per day. She uses smokeless tobacco. She reports that she does not drink alcohol. Her drug history is not on file.  No Known Allergies  Family History  Problem Relation Age of Onset  . Adopted: Yes    Prior to Admission medications   Medication Sig Start Date End Date Taking? Authorizing Provider  albuterol (PROVENTIL HFA;VENTOLIN HFA) 108 (90 Base) MCG/ACT inhaler Inhale 2 puffs into the lungs every 4 (four) hours as needed for wheezing or shortness of breath.    Historical Provider, MD  aspirin 81 MG chewable tablet Chew 81 mg by mouth daily. 03/20/10   Historical Provider, MD  atorvastatin (LIPITOR) 80 MG tablet Take 1 tablet by mouth every evening. 08/09/15   Historical Provider, MD  clonazePAM (KLONOPIN) 1 MG tablet Take 1 tablet by mouth at bedtime as needed. 10/08/15   Historical Provider, MD  diphenhydramine-acetaminophen (TYLENOL PM) 25-500 MG TABS tablet Take 2 tablets by mouth at bedtime as needed.    Historical Provider, MD  Fluticasone-Salmeterol (ADVAIR DISKUS) 250-50 MCG/DOSE AEPB Inhale 2 puffs into the lungs 2 (two) times daily. 01/10/12   Historical Provider, MD  K-TAB 20 MEQ TBCR Take 3 tablets by mouth daily.  08/09/15   Historical Provider, MD  levETIRAcetam (KEPPRA) 500 MG tablet Take 1 tablet (500 mg total) by mouth 2 (two) times daily. 11/23/15   Coletta Memos, MD  lisinopril (PRINIVIL,ZESTRIL) 20 MG tablet Take 1 tablet by mouth 2 (two) times daily. 08/09/15   Historical Provider, MD  metoprolol tartrate (LOPRESSOR) 25 MG tablet Take 1 tablet by mouth 2 (two) times daily. 08/14/15   Historical Provider, MD    nitroGLYCERIN (NITROSTAT) 0.4 MG SL tablet Place 0.4 mg under the tongue. 02/17/10   Historical Provider, MD  pantoprazole (PROTONIX) 40 MG tablet Take 40 mg by mouth daily.    Historical Provider, MD  quinapril (ACCUPRIL) 20 MG tablet Take 20 mg by mouth at bedtime. 03/01/11   Historical Provider, MD  traMADol (ULTRAM) 50 MG tablet Take 1 tablet (50 mg total) by mouth every 6 (six) hours as needed. 11/23/15   Coletta Memos, MD  zolpidem (AMBIEN CR) 12.5 MG CR tablet Take 10 mg by mouth at bedtime as needed.    Historical Provider, MD    Physical Exam: Vitals:   11/23/15 1820 11/23/15 1915 11/23/15 2000 11/23/15 2015  BP: 113/63 138/71 121/72 108/59  Pulse: 69 (!) 57 60 (!) 56  Resp: Temp:      TempSrc:      SpO2: 95% 98% 96% 95%      Constitutional: NAD, calm, comfortable Vitals:   11/23/15 1820 11/23/15 1915 11/23/15 2000 11/23/15 2015  BP: 113/63 138/71 121/72 108/59  Pulse: 69 (!) 57 60 (!) 56  Resp: Temp:      TempSrc:      SpO2: 95% 98% 96% 95%   Eyes: PERRL, injected sclerae and conjunctivae, Small crusted and moist purulent material. ENMT: Mucous membranes are moist. Posterior pharynx clear of any exudate or lesions. Neck: normal, supple, no masses, no thyromegaly Respiratory: Clear to auscultation bilaterally, no wheezing, no crackles. Normal respiratory effort. Cardiovascular: Bradycardic at 56 BPM, no murmurs / rubs / gallops. No extremity edema. 2+ pedal pulses. No carotid bruits.  Abdomen: No distention. Bowel sounds positive. Soft, no tenderness, no masses palpated. No hepatosplenomegaly.  Musculoskeletal: no clubbing / cyanosis. Good ROM, no contractures. Normal muscle tone.  Skin: RLE erythema from insect bite. Neurologic: CN 2-12 grossly intact. Sensation intact, DTR normal. Strength 5/5 in all 4.  Psychiatric: Normal judgment and insight. Alert and oriented x 4. Normal mood.    Labs on Admission: I have personally reviewed following  labs and imaging studies  CBC:  Recent Labs Lab 11/23/15 1535  WBC 12.0*  HGB 14.4  HCT 44.0  MCV 100.0  PLT 282   Basic Metabolic Panel:  Recent Labs Lab 11/23/15 1535  NA 138  K 4.3  CL 101  CO2 27  GLUCOSE 128*  BUN 8  CREATININE 0.57  CALCIUM 9.3   GFR: CrCl cannot be calculated (Unknown ideal weight.).   Radiological Exams on Admission: Dg Chest 2 View  Result Date: 11/23/2015 CLINICAL DATA:  Left-sided chest pain this morning. EXAM: CHEST  2 VIEW COMPARISON:  11/07/2015 FINDINGS: Heart is mildly enlarged. Lungs are clear. No effusions or edema. No acute bony abnormality. IMPRESSION: Mild cardiomegaly.  No active disease. Electronically Signed   By: Charlett Nose M.D.   On: 11/23/2015 16:42  Ct Head Wo Contrast  Result Date: 11/21/2015 CLINICAL DATA:  Persistent headache. EXAM: CT HEAD WITHOUT CONTRAST TECHNIQUE: Contiguous axial images were obtained from the base of  the skull through the vertex without intravenous contrast. COMPARISON:  MRI brain 11/10/2015. CT head without contrast 11/08/2015. FINDINGS: Subarachnoid hemorrhage in the anterior left frontal lobe demonstrates expected evolution. There is no new hemorrhage or progression. Subcortical white matter hypoattenuation over the parietal and posterior frontal lobes bilaterally also demonstrate significant improvement. No acute cortical infarct or hemorrhage is present. The ventricles are of normal size. The paranasal sinuses and mastoid air cells are clear. The calvarium is intact. No significant extracranial soft tissue lesions are present. IMPRESSION: 1. Marked improvement and subcortical white matter hypoattenuation over the posterior parietal and frontal lobes compatible with resolving PRES. 2. Expected evolution of anterior left frontal lobe subarachnoid hemorrhage. No new hemorrhage or progression. 3. No other acute intracranial abnormality. Electronically Signed   By: Marin Roberts M.D.   On: 11/21/2015  21:25  03/16/2013 left heart catheterization  SUMMARY FINDINGS: Mild Non-ObstructiveCoronary ArteryDisease Thereis no angiographicevidence of significantcoronary disease. Normalleft ventricularcontractile function(estimated LVEF= 55%).Left ventricle apicalneurysm. Mildlyelevated left-sidedfilling pressures.(LVEDP =27mmHg)  EKG: Independently reviewed.  Vent. rate 60 BPM PR interval * ms QRS duration 96 ms QT/QTc 408/408 ms P-R-T axes 65 39 88 Sinus rhythm Consider right atrial enlargement T wave abnormality Abnormal ekg  Assessment/Plan Principal Problem:   Chest pain Admit to telemetry/observation. EKG shows T-wave inversion on antero-lateral leads.  No previous EKGs to compare to. This is reported to be similar on the care everywhere tab.  No Myoview stress test results on care everywhere tab. I will try to get those results via fax. Continue Nitropaste. Continue supplemental oxygen Continue low-dose aspirin. Continue beta blocker. Trend troponin levels.  Active Problems:   CAD (coronary artery disease) Continue aspirin 81 mg by mouth daily. Continue atorvastatin 80 mg by mouth daily. Continue metoprolol 25 mg by mouth twice a day. Continue risk factor modifications.    Chronic obstructive pulmonary disease (HCC) Stable.  Per patient she has not smoked since the 12th this month. Continue Advair or formulary equivalent inhaler twice a day. Duo nebs as needed for dyspnea and wheezing.    Conjunctivitis Per patient, she has had this for about a week. topical gentamicin ointment.    Essential hypertension Continue lisinopril 20 mg by mouth twice a day. Continue metoprolol 25 mg by mouth twice a day    Tobacco use disorder Per patient, she has not smoked in 2 weeks. Declined nicotine replacement therapy, since she does not have any withdrawal symptoms at this time.      Hyperlipidemia Continue atorvastatin  80 mg by mouth daily. Monitor LFTs periodically.    Anxiety Continue clonazepam 0.5 mg by mouth twice a day when necessary    Depression Not on antidepressants at this time. Denies depression symptoms, suicidal or homicidal thoughts.    Seizures (HCC) Continue Keppra 500 mg by mouth twice a day.    Subarachnoid bleed (HCC) Improving per recent follow-up by neurosurgery.    DVT prophylaxis: SCDs. Recent IC bleed at [facility]. Code Status: Full code. Family Communication:  Disposition Plan: Admit to telemetry, troponin level cycling. Consults called:  Admission status: Observation/telemetry   Bobette Mo MD [facility] Pager [phone]  If 7PM-7AM, please contact night-coverage [email or web address] Password [facility]  11/23/2015, 8:27 PM

## 2015-11-23 NOTE — Progress Notes (Signed)
Patient will DC to: [facility] Anticipated DC date: 11/23/15 Family notified: Patient alerting family Transport by: [facility] 12pm- will call nurse's station   Per MD patient ready for DC to [facility]. RN, patient, patient's family, and facility notified of DC. RN given number for report. DC packet on chart. Wheelchair Zenaida Niece transport requested for patient ([facility])  CSW signing off.  Cristobal Goldmann, [place] Clinical Social Worker [phone]

## 2015-11-23 NOTE — ED Notes (Signed)
Attempted report 

## 2015-11-23 NOTE — Progress Notes (Signed)
Speech Language Pathology Treatment: Cognitive-Linquistic  Patient Details Name: Joan Newman MRN: [id number] DOB: [birth year] Today's Date: 11/23/2015 Time: 8413-2440 SLP Time Calculation (min) (ACUTE ONLY): 15 min  Assessment / Plan / Recommendation Clinical Impression  Cognitive-linguistic treatment provided for short-term memory, safety awareness, and problem solving skills. Pt reports doing "about the same" with short-term memory skills- described occasionally being told something and then forgetting it 5 minutes later. Reviewed compensatory strategies such as writing down notes, utilizing calendar or alerts in phone. Discussed medication management strategies; pt reported planning to begin utilizing a pill box to keep track. Pt also explained having some difficulties with short-term memory and everyday tasks shortly prior to hospital admission and provided the example of occasionally leaving the stove on after having dinner. Pt is very aware of areas of difficulty, able to describe activities performed with PT and OT. Recommend brief f/u with SLP at next level of care to address primarily short-term memory skills/ review compensatory strategies.    HPI HPI: 55 yr old found minimally responsive this morning with observed seizures Head ct showed a small amount of left frontal subarachnoid blood, not suggestive an aneurysmal hemorrhage but of PRES. No prior PMH documented.      SLP Plan  Continue with current plan of care     Recommendations                Follow up Recommendations: 24 hour supervision/assistance Plan: Continue with current plan of care     GO                Metro Kung, MA, CCC-SLP 11/23/2015, 10:29 AM [phone]

## 2015-11-23 NOTE — ED Notes (Signed)
Sandwich given 

## 2015-11-23 NOTE — Progress Notes (Signed)
PT Cancellation Note  Patient Details Name: Joan Newman MRN: [id number] DOB: 1960-10-11   Cancelled Treatment:    Reason Eval/Treat Not Completed: Patient declined, no reason specified.  Patient preparing to be d/c today, and declined PT.     Vena Austria 11/23/2015, 11:06 AM Durenda Hurt. Renaldo Fiddler, [facility] Acute Rehab Services Pager [phone]

## 2015-11-23 NOTE — ED Triage Notes (Signed)
Pt here from [facility] with c/o chest pain , pt received 324 asa from ems along with 1 nitro which reduced pt's pain down to a 7 , pt was just discharged from  Hospital for seizures today

## 2015-11-23 NOTE — Progress Notes (Signed)
Report given to [facility] from [facility]. All belongings packed and ready. RXs in [facility] envelop, given to pt. Patient awaiting for transport.   Sim Boast, RN

## 2015-11-23 NOTE — Progress Notes (Signed)
Attempted to get report. 

## 2015-11-24 DIAGNOSIS — E785 Hyperlipidemia, unspecified: Secondary | ICD-10-CM

## 2015-11-24 DIAGNOSIS — I10 Essential (primary) hypertension: Secondary | ICD-10-CM

## 2015-11-24 DIAGNOSIS — F17200 Nicotine dependence, unspecified, uncomplicated: Secondary | ICD-10-CM

## 2015-11-24 DIAGNOSIS — I209 Angina pectoris, unspecified: Secondary | ICD-10-CM

## 2015-11-24 DIAGNOSIS — I2510 Atherosclerotic heart disease of native coronary artery without angina pectoris: Secondary | ICD-10-CM

## 2015-11-24 DIAGNOSIS — F419 Anxiety disorder, unspecified: Secondary | ICD-10-CM

## 2015-11-24 DIAGNOSIS — I609 Nontraumatic subarachnoid hemorrhage, unspecified: Secondary | ICD-10-CM

## 2015-11-24 DIAGNOSIS — R569 Unspecified convulsions: Secondary | ICD-10-CM

## 2015-11-24 DIAGNOSIS — R079 Chest pain, unspecified: Secondary | ICD-10-CM

## 2015-11-24 LAB — CBC WITH DIFFERENTIAL/PLATELET
BASOS PCT: 0 %
Basophils Absolute: 0 10*3/uL (ref 0.0–0.1)
Eosinophils Absolute: 0.3 10*3/uL (ref 0.0–0.7)
Eosinophils Relative: 3 %
HEMATOCRIT: 42.9 % (ref 36.0–46.0)
Hemoglobin: 13.8 g/dL (ref 12.0–15.0)
LYMPHS ABS: 3 10*3/uL (ref 0.7–4.0)
Lymphocytes Relative: 31 %
MCH: 32.4 pg (ref 26.0–34.0)
MCHC: 32.2 g/dL (ref 30.0–36.0)
MCV: 100.7 fL — ABNORMAL HIGH (ref 78.0–100.0)
MONO ABS: 0.7 10*3/uL (ref 0.1–1.0)
MONOS PCT: 7 %
NEUTROS ABS: 5.6 10*3/uL (ref 1.7–7.7)
Neutrophils Relative %: 59 %
Platelets: 265 10*3/uL (ref 150–400)
RBC: 4.26 MIL/uL (ref 3.87–5.11)
RDW: 13.6 % (ref 11.5–15.5)
WBC: 9.6 10*3/uL (ref 4.0–10.5)

## 2015-11-24 LAB — COMPREHENSIVE METABOLIC PANEL
ALK PHOS: 76 U/L (ref 38–126)
ALT: 20 U/L (ref 14–54)
AST: 17 U/L (ref 15–41)
Albumin: 3.1 g/dL — ABNORMAL LOW (ref 3.5–5.0)
Anion gap: 6 (ref 5–15)
BILIRUBIN TOTAL: 0.6 mg/dL (ref 0.3–1.2)
BUN: 11 mg/dL (ref 6–20)
CHLORIDE: 103 mmol/L (ref 101–111)
CO2: 29 mmol/L (ref 22–32)
Calcium: 9 mg/dL (ref 8.9–10.3)
Creatinine, Ser: 0.54 mg/dL (ref 0.44–1.00)
GFR calc Af Amer: >60 mL/min (ref >60)
GFR calc non Af Amer: >60 mL/min (ref >60)
GLUCOSE: 129 mg/dL — AB (ref 65–99)
POTASSIUM: 4 mmol/L (ref 3.5–5.1)
SODIUM: 138 mmol/L (ref 135–145)
Total Protein: 6.1 g/dL — ABNORMAL LOW (ref 6.5–8.1)

## 2015-11-24 LAB — TROPONIN I: Troponin I: <0.03 ng/mL (ref <0.03)

## 2015-11-24 MED ORDER — PROCHLORPERAZINE EDISYLATE 5 MG/ML IJ SOLN
10.0000 mg | Freq: Four times a day (QID) | INTRAMUSCULAR | Status: DC | PRN
  Administered 2015-11-25: 10 mg via INTRAVENOUS
  Filled 2015-11-24 (×2): qty 2

## 2015-11-24 MED ORDER — GI COCKTAIL ~~LOC~~
30.0000 mL | Freq: Three times a day (TID) | ORAL | Status: DC | PRN
  Administered 2015-11-24: 30 mL via ORAL
  Filled 2015-11-24: qty 30

## 2015-11-24 MED ORDER — ISOSORBIDE MONONITRATE ER 30 MG PO TB24
30.0000 mg | ORAL_TABLET | Freq: Every day | ORAL | Status: DC
  Administered 2015-11-24 – 2015-11-27 (×4): 30 mg via ORAL
  Filled 2015-11-24 (×4): qty 1

## 2015-11-24 MED ORDER — PANTOPRAZOLE SODIUM 40 MG PO TBEC
40.0000 mg | DELAYED_RELEASE_TABLET | Freq: Two times a day (BID) | ORAL | Status: DC
  Administered 2015-11-24 – 2015-11-27 (×6): 40 mg via ORAL
  Filled 2015-11-24 (×6): qty 1

## 2015-11-24 MED ORDER — MORPHINE SULFATE (PF) 2 MG/ML IV SOLN
2.0000 mg | INTRAVENOUS | Status: DC | PRN
  Administered 2015-11-24 – 2015-11-25 (×5): 2 mg via INTRAVENOUS
  Filled 2015-11-24 (×5): qty 1

## 2015-11-24 NOTE — Consult Note (Addendum)
Patient ID: Joan Newman MRN: [id number] DOB/AGE: 54-21-1962 54 y.o.  Admit date: 11/23/2015 Primary Physician Lonie Peak, PA-C  Primary Cardiologist Chip Boer, MD [facility])   Chief Complaint  Chest pain.  HPI: Joan Newman is a 55 year old woman with CAD status post NSTEMI (DES-->LAD 2011), hypertension, COPD, seizures, recent SAH and tobacco abuse here with chest pain.  Joan Newman  was treated at [facility] for seizures and a subarachnoid hemorrhage.  She was subsequently treated at [facility] and discharged to rehabilitation on 7/27. Upon arriving at rehabilitation she had an episode of substernal chest pain. The pain is worse with exertion. There is no associated shortness of breath, nausea, or diaphoresis. The pain was similar to her prior MI so she returned to the ED for evaluation. In the ED her cardiac enzymes have been negative times three.  EKG showed sinus rhythm with lateral T wave inversions unchanged from prior. CT of the head on 7/26 revealed improvement in the subcortical white matter hypoattenuation and expected evolution of the anterior left frontal lobe subarachnoid hemorrhage. There is no new hemorrhage or progression. She was admitted for further evaluation of her chest pain.  She continues to have exertional chest pain but has no chest pain while resting this morning. She does endorse a history of gastroesophageal reflux disease that has been poorly-controlled.  Joan Newman was seen at [facility] on 10/04/15 for chest pain.  Troponin was mildly elevated (0.06) but is all was felt to be due to demand ischemia in the setting of infection.  She was treated with IV heparin and reportedly had a nuclear stress test that was negative.  However the actual stress results are not available at this time.   Review of Systems:  A 12 point review of systems was obtained and was negative with exceptions noted in the HPI.   Past Medical History:  Diagnosis Date  . Anxiety     . Coronary artery disease   . Depression   . Hypertension   . Seizures (HCC)   . Stroke [facility])     Medications Prior to Admission  Medication Sig Dispense Refill  . albuterol (PROVENTIL HFA;VENTOLIN HFA) 108 (90 Base) MCG/ACT inhaler Inhale 2 puffs into the lungs every 4 (four) hours as needed for wheezing or shortness of breath.    Marland Kitchen aspirin 81 MG chewable tablet Chew 81 mg by mouth daily.    Marland Kitchen atorvastatin (LIPITOR) 80 MG tablet Take 1 tablet by mouth every evening.  5  . clonazePAM (KLONOPIN) 1 MG tablet Take 1 tablet by mouth at bedtime as needed.  5  . diphenhydramine-acetaminophen (TYLENOL PM) 25-500 MG TABS tablet Take 2 tablets by mouth at bedtime as needed.    . Fluticasone-Salmeterol (ADVAIR DISKUS) 250-50 MCG/DOSE AEPB Inhale 2 puffs into the lungs 2 (two) times daily.    Marland Kitchen K-TAB 20 MEQ TBCR Take 3 tablets by mouth daily.  5  . levETIRAcetam (KEPPRA) 500 MG tablet Take 1 tablet (500 mg total) by mouth 2 (two) times daily. 30 tablet 0  . lisinopril (PRINIVIL,ZESTRIL) 20 MG tablet Take 1 tablet by mouth 2 (two) times daily.  1  . metoprolol tartrate (LOPRESSOR) 25 MG tablet Take 1 tablet by mouth 2 (two) times daily.  1  . nitroGLYCERIN (NITROSTAT) 0.4 MG SL tablet Place 0.4 mg under the tongue.    . pantoprazole (PROTONIX) 40 MG tablet Take 40 mg by mouth daily.    . quinapril (ACCUPRIL) 20 MG tablet Take 20  mg by mouth at bedtime.    . traMADol (ULTRAM) 50 MG tablet Take 1 tablet (50 mg total) by mouth every 6 (six) hours as needed. 60 tablet 0  . zolpidem (AMBIEN CR) 12.5 MG CR tablet Take 10 mg by mouth at bedtime as needed.       Marland Kitchen aspirin  81 mg Oral Daily  . atorvastatin  80 mg Oral QPM  . gentamicin ointment   Topical TID  . levETIRAcetam  500 mg Oral BID  . lisinopril  20 mg Oral BID  . metoprolol tartrate  25 mg Oral BID  . mometasone-formoterol  2 puff Inhalation BID  . pantoprazole  40 mg Oral Daily  . potassium chloride SA  60 mEq Oral Daily  . sodium  chloride flush  3 mL Intravenous Q12H    Infusions:    No Known Allergies  Social History   Social History  . Marital status: Widowed    Spouse name: N/A  . Number of children: N/A  . Years of education: N/A   Occupational History  . Not on file.   Social History Main Topics  . Smoking status: Current Every Day Smoker    Packs/day: 2.00    Types: Cigarettes  . Smokeless tobacco: Current User  . Alcohol use No  . Drug use: Unknown  . Sexual activity: Not on file   Other Topics Concern  . Not on file   Social History Narrative  . No narrative on file    Family History  Problem Relation Age of Onset  . Adopted: Yes    PHYSICAL EXAM: Vitals:   11/24/15 0458 11/24/15 0810  BP: (!) 113/51 (!) 125/45  Pulse: 64 (!) 59  Resp: 16 17  Temp: 97.5 F (36.4 C) 97.5 F (36.4 C)     Intake/Output Summary (Last 24 hours) at 11/24/15 6962 Last data filed at 11/24/15 0500  Gross per 24 hour  Intake                0 ml  Output              800 ml  Net             -800 ml    General:  Well appearing. No respiratory difficulty HEENT: Bilateral conjunctival injection with appointment in place Neck: supple. no JVD. Carotids 2+ bilat; no bruits. No lymphadenopathy or thryomegaly appreciated. Cor: PMI nondisplaced. Regular rate & rhythm. No rubs, gallops or murmurs. Lungs: clear Abdomen: soft, nontender, nondistended. No hepatosplenomegaly. No bruits or masses. Good bowel sounds. Extremities: no cyanosis, clubbing, rash, edema Neuro: alert & oriented x 3, cranial nerves grossly intact. moves all 4 extremities w/o difficulty. Affect pleasant.  Results for orders placed or performed during the hospital encounter of 11/23/15 (from the past 24 hour(s))  I-stat troponin, ED (0, 3, 6)  not at [facility]     Status: None   Collection Time: 11/23/15  3:33 PM  Result Value Ref Range   Troponin i, poc 0.00 0.00 - 0.08 ng/mL   Comment 3          CBC     Status: Abnormal    Collection Time: 11/23/15  3:35 PM  Result Value Ref Range   WBC 12.0 (H) 4.0 - 10.5 K/uL   RBC 4.40 3.87 - 5.11 MIL/uL   Hemoglobin 14.4 12.0 - 15.0 g/dL   HCT 95.2 84.1 - 32.4 %   MCV 100.0 78.0 - 100.0  fL   MCH 32.7 26.0 - 34.0 pg   MCHC 32.7 30.0 - 36.0 g/dL   RDW 40.9 81.1 - 91.4 %   Platelets 282 150 - 400 K/uL  Basic metabolic panel     Status: Abnormal   Collection Time: 11/23/15  3:35 PM  Result Value Ref Range   Sodium 138 135 - 145 mmol/L   Potassium 4.3 3.5 - 5.1 mmol/L   Chloride 101 101 - 111 mmol/L   CO2 27 22 - 32 mmol/L   Glucose, Bld 128 (H) 65 - 99 mg/dL   BUN 8 6 - 20 mg/dL   Creatinine, Ser 7.82 0.44 - 1.00 mg/dL   Calcium 9.3 8.9 - 95.6 mg/dL   GFR calc non Af Amer >60 >60 mL/min   GFR calc Af Amer >60 >60 mL/min   Anion gap 10 5 - 15  I-stat troponin, ED (0, 3, 6)  not at [facility]     Status: None   Collection Time: 11/23/15  6:28 PM  Result Value Ref Range   Troponin i, poc 0.00 0.00 - 0.08 ng/mL   Comment 3          Troponin I     Status: None   Collection Time: 11/23/15  9:31 PM  Result Value Ref Range   Troponin I <0.03 <0.03 ng/mL  CBC WITH DIFFERENTIAL     Status: Abnormal   Collection Time: 11/24/15  3:19 AM  Result Value Ref Range   WBC 9.6 4.0 - 10.5 K/uL   RBC 4.26 3.87 - 5.11 MIL/uL   Hemoglobin 13.8 12.0 - 15.0 g/dL   HCT 21.3 08.6 - 57.8 %   MCV 100.7 (H) 78.0 - 100.0 fL   MCH 32.4 26.0 - 34.0 pg   MCHC 32.2 30.0 - 36.0 g/dL   RDW 46.9 62.9 - 52.8 %   Platelets 265 150 - 400 K/uL   Neutrophils Relative % 59 %   Neutro Abs 5.6 1.7 - 7.7 K/uL   Lymphocytes Relative 31 %   Lymphs Abs 3.0 0.7 - 4.0 K/uL   Monocytes Relative 7 %   Monocytes Absolute 0.7 0.1 - 1.0 K/uL   Eosinophils Relative 3 %   Eosinophils Absolute 0.3 0.0 - 0.7 K/uL   Basophils Relative 0 %   Basophils Absolute 0.0 0.0 - 0.1 K/uL  Troponin I     Status: None   Collection Time: 11/24/15  3:19 AM  Result Value Ref Range   Troponin I <0.03 <0.03 ng/mL    Comprehensive metabolic panel     Status: Abnormal   Collection Time: 11/24/15  3:19 AM  Result Value Ref Range   Sodium 138 135 - 145 mmol/L   Potassium 4.0 3.5 - 5.1 mmol/L   Chloride 103 101 - 111 mmol/L   CO2 29 22 - 32 mmol/L   Glucose, Bld 129 (H) 65 - 99 mg/dL   BUN 11 6 - 20 mg/dL   Creatinine, Ser 4.13 0.44 - 1.00 mg/dL   Calcium 9.0 8.9 - 24.4 mg/dL   Total Protein 6.1 (L) 6.5 - 8.1 g/dL   Albumin 3.1 (L) 3.5 - 5.0 g/dL   AST 17 15 - 41 U/L   ALT 20 14 - 54 U/L   Alkaline Phosphatase 76 38 - 126 U/L   Total Bilirubin 0.6 0.3 - 1.2 mg/dL   GFR calc non Af Amer >60 >60 mL/min   GFR calc Af Amer >60 >60 mL/min   Anion  gap 6 5 - 15   Dg Chest 2 View  Result Date: 11/23/2015 CLINICAL DATA:  Left-sided chest pain this morning. EXAM: CHEST  2 VIEW COMPARISON:  11/07/2015 FINDINGS: Heart is mildly enlarged. Lungs are clear. No effusions or edema. No acute bony abnormality. IMPRESSION: Mild cardiomegaly.  No active disease. Electronically Signed   By: Charlett Nose M.D.   On: 11/23/2015 16:42   Telemetry: Sinus rhythm.  No events.   ASSESSMENT/PLAN:  # Chest pain: # CAD s/p NSTEMI 2011: Joan Newman continues to have intermittent episodes of chest pain. However, cardiac enzymes are negative and her EKG is unchanged from prior. She had a stress test last month that was reportedly negative for ischemia.  The actual results of this stress is not yet available. Given this finding and the fact that she had a recent subarachnoid hemorrhage, would not pursue cardiac catheterization at this time. We will obtain an echocardiogram. If this is normal we will not proceed with stress testing, as she had a recent stress test. Continue with conservative management. Her PPI was recently restarted. however, she continues to have some symptoms of gastroesophageal reflux disease. Her chest pain could be due to esophageal spasm. We'll also try a GI cocktail. Continue aspirin, atorvastatin, metorpolol  and nitro patch. THis can be switched to Imdur if it is helpful.  # Hypertension: BP well-controlled on metoprolol quinapril however, she should not be on 2 Ace inhibitors. We will stop quinapril and continue lisinopril. and lisinopril.    # Hyperlipidemia: Continue atorvastatin.  Check fasting lipids in the a.m.    # Recent SAH:  Signed: Sylvester Salonga C. Duke Salvia, MD, [facility]  11/24/2015, 9:07 AM

## 2015-11-24 NOTE — Progress Notes (Signed)
PROGRESS NOTE    Danielly Ackerley  RUE:[id number] DOB: Oct 04, 1960 DOA: 11/23/2015 PCP: Lonie Peak, PA-C   Brief Narrative:  Kameisha Malicki is a 55 y.o. female with medical history significant of anxiety, depression, coronary artery disease,  NSTEMI in 2011 s/p DES to LAD, hypertension,  COPD, tobacco use disorder who was discharged from [facility] to [facility] care following an admission for seizures and subarachnoid hemorrhage on 11/07/2015.  Per patient, around 1300, 1 1/2 hours after being discharged from [facility] and arriving to the [facility] care facility, she experienced precordial chest pain while sitting in bed, stabbing like, radiating to her left shoulder and back associated with dyspnea, mild diaphoresis, nausea and palpitations. The patient rated her pain at 10/10 at that point. She denies pitting edema of the lower extremities, orthopnea or PND.  EMS was subsequently called. She received 324 mg of aspirin and sublingual nitroglycerin which decreased the pain to about 7/10. She was started on nitro paste in the emergency department which has decreased the discomfort further, but still complains of some chest pain. However she states that she has a significant headache now (has been having headache previously, but it seems that nitroglycerin has worsened it).  She denies fever, chills, abdominal pain, diarrhea, constipation, dysuria or hematuria. . She states that last month she was admitted for chest pain at [facility] and underwent a Myoview stress test, which apparently was negative. However, I am unable to see those results on the care everywhere tab as its assigned form shows her discharge summary instead.   ED Course: The patient was given on Nitropaste reporting partial relief. Workup shows EKG with inverted T waves on anterolateral leads, mild leukocytosis of 12 K, and hyperglycemia 128 mg/dL.   Assessment & Plan:   Principal Problem:   Chest pain Active Problems:  Chronic obstructive pulmonary disease (HCC)   Essential hypertension   Tobacco use disorder   CAD (coronary artery disease)   Hyperlipidemia   Anxiety   Depression   Seizures (HCC)   Subarachnoid bleed (HCC)   #1 chest pain Patient presenting with persistent chest pain intermittent in nature worse on exertion. Patient with prior history of coronary artery disease status post  NSTEMI 2011. Cardiac enzymes negative 2. EKG with T-wave inversions in V2, V4 through V6. 2-D echo pending. Patient would recent subarachnoid hemorrhage and a such cannot be placed on IV heparin. Patient with nitroglycerin paste/patch on. Patient has been seen by cardiology were recommending conservative management at this time. If 2-D echo is normal no need for stress testing per cardiology. Consent for possible esophageal spasms. GI cocktail as needed. Continue aspirin, Lipitor, lisinopril, metoprolol. Increase PPI to twice daily. Will discontinue Nitropatch/paste due to worsening headache and place on Imdur 30 mg daily. Discussed with cardiology.  #2 hypertension Stable. Continue current regimen of Lopressor, lisinopril.  #3 hyperlipidemia Check a fasting lipid panel. Continue statin.  #4 seizures Stable. No seizures noted during this hospitalization. Continue Keppra.  #5 recent subarachnoid hemorrhage Patient would recent CT head 11/21/2015 showing marked improvement and subcortical white matter hypoattenuation over the posterior parietal and frontal lobes compatible with resolving PRES. expected evolution of anterior left frontal lobe subarachnoid hemorrhage. No new hemorrhage or progression. No other acute intracranial abnormality. Outpatient follow-up with neurosurgery.  #6 headache Likely multifactorial secondary to recent subarachnoid hemorrhage and nitroglycerin paste. Will discontinue nitroglycerin paste. Symptomatic treatment.  #7 depression/anxiety Stable. Continue Klonopin as needed.     DVT  prophylaxis: SCDs Code Status: Full  Family Communication: Updated patient. No family at bedside. Disposition Plan: Home when medically stable and per cardiology.   Consultants:   Cardiology: Dr. Duke Salvia 11/24/2015  Procedures:   Chest x-ray 11/23/2015  2-D echo pending  Antimicrobials:   None   Subjective: Patient with complaints of chest pain on exertion to the bathroom this morning. Patient complaining of a headache.  Objective: Vitals:   11/23/15 2238 11/24/15 0045 11/24/15 0458 11/24/15 0810  BP:  (!) 99/49 (!) 113/51 (!) 125/45  Pulse: 61 (!) 51 64 (!) 59  Resp:  Temp:  97.8 F (36.6 C) 97.5 F (36.4 C) 97.5 F (36.4 C)  TempSrc:  Oral Oral Oral  SpO2:  98% 96% 93%  Weight:   71.4 kg (157 lb 4.8 oz)   Height:        Intake/Output Summary (Last 24 hours) at 11/24/15 1138 Last data filed at 11/24/15 0900  Gross per 24 hour  Intake                0 ml  Output             1100 ml  Net            -1100 ml   Filed Weights   11/23/15 2040 11/24/15 0458  Weight: 71.4 kg (157 lb 8 oz) 71.4 kg (157 lb 4.8 oz)    Examination:  General exam: Appears calm and comfortable  Respiratory system: Clear to auscultation. Respiratory effort normal. Cardiovascular system: S1 & S2 heard, RRR. No JVD, murmurs, rubs, gallops or clicks. No pedal edema. Gastrointestinal system: Abdomen is nondistended, soft and nontender. No organomegaly or masses felt. Normal bowel sounds heard. Central nervous system: Alert and oriented. No focal neurological deficits. Extremities: Symmetric 5 x 5 power. Skin: No rashes, lesions or ulcers Psychiatry: Judgement and insight appear normal. Mood & affect appropriate.     Data Reviewed: I have personally reviewed following labs and imaging studies  CBC:  Recent Labs Lab 11/23/15 1535 11/24/15 0319  WBC 12.0* 9.6  NEUTROABS  --  5.6  HGB 14.4 13.8  HCT 44.0 42.9  MCV 100.0 100.7*  PLT 282 265   Basic Metabolic  Panel:  Recent Labs Lab 11/23/15 1535 11/24/15 0319  NA 138 138  K 4.3 4.0  CL 101 103  CO2 27 29  GLUCOSE 128* 129*  BUN 8 11  CREATININE 0.57 0.54  CALCIUM 9.3 9.0   GFR: Estimated Creatinine Clearance: 72.6 mL/min (by C-G formula based on SCr of 0.8 mg/dL). Liver Function Tests:  Recent Labs Lab 11/24/15 0319  AST 17  ALT 20  ALKPHOS 76  BILITOT 0.6  PROT 6.1*  ALBUMIN 3.1*   No results for input(s): LIPASE, AMYLASE in the last 168 hours. No results for input(s): AMMONIA in the last 168 hours. Coagulation Profile: No results for input(s): INR, PROTIME in the last 168 hours. Cardiac Enzymes:  Recent Labs Lab 11/23/15 2131 11/24/15 0319  TROPONINI <0.03 <0.03   BNP (last 3 results) No results for input(s): PROBNP in the last 8760 hours. HbA1C: No results for input(s): HGBA1C in the last 72 hours. CBG: No results for input(s): GLUCAP in the last 168 hours. Lipid Profile: No results for input(s): CHOL, HDL, LDLCALC, TRIG, CHOLHDL, LDLDIRECT in the last 72 hours. Thyroid Function Tests: No results for input(s): TSH, T4TOTAL, FREET4, T3FREE, THYROIDAB in the last 72 hours. Anemia Panel: No results for input(s): VITAMINB12, FOLATE, FERRITIN, TIBC, IRON, RETICCTPCT  in the last 72 hours. Sepsis Labs: No results for input(s): PROCALCITON, LATICACIDVEN in the last 168 hours.  No results found for this or any previous visit (from the past 240 hour(s)).       Radiology Studies: Dg Chest 2 View  Result Date: 11/23/2015 CLINICAL DATA:  Left-sided chest pain this morning. EXAM: CHEST  2 VIEW COMPARISON:  11/07/2015 FINDINGS: Heart is mildly enlarged. Lungs are clear. No effusions or edema. No acute bony abnormality. IMPRESSION: Mild cardiomegaly.  No active disease. Electronically Signed   By: Charlett Nose M.D.   On: 11/23/2015 16:42       Scheduled Meds: . aspirin  81 mg Oral Daily  . atorvastatin  80 mg Oral QPM  . gentamicin ointment   Topical TID  .  levETIRAcetam  500 mg Oral BID  . lisinopril  20 mg Oral BID  . metoprolol tartrate  25 mg Oral BID  . mometasone-formoterol  2 puff Inhalation BID  . pantoprazole  40 mg Oral Daily  . potassium chloride SA  60 mEq Oral Daily  . sodium chloride flush  3 mL Intravenous Q12H   Continuous Infusions:    LOS: 0 days    Time spent: 40 mins    Tasman Zapata, MD [facility] Pager [phone]  If 7PM-7AM, please contact night-coverage [email or web address] Password [facility] 11/24/2015, 11:38 AM

## 2015-11-25 ENCOUNTER — Observation Stay (HOSPITAL_COMMUNITY): Payer: Self-pay

## 2015-11-25 DIAGNOSIS — R079 Chest pain, unspecified: Secondary | ICD-10-CM | POA: Diagnosis present

## 2015-11-25 DIAGNOSIS — J449 Chronic obstructive pulmonary disease, unspecified: Secondary | ICD-10-CM

## 2015-11-25 DIAGNOSIS — R519 Headache, unspecified: Secondary | ICD-10-CM

## 2015-11-25 DIAGNOSIS — R51 Headache: Secondary | ICD-10-CM

## 2015-11-25 DIAGNOSIS — R072 Precordial pain: Secondary | ICD-10-CM

## 2015-11-25 LAB — CBC
HEMATOCRIT: 43.5 % (ref 36.0–46.0)
HEMOGLOBIN: 14.1 g/dL (ref 12.0–15.0)
MCH: 32.9 pg (ref 26.0–34.0)
MCHC: 32.4 g/dL (ref 30.0–36.0)
MCV: 101.4 fL — ABNORMAL HIGH (ref 78.0–100.0)
Platelets: 280 10*3/uL (ref 150–400)
RBC: 4.29 MIL/uL (ref 3.87–5.11)
RDW: 13.5 % (ref 11.5–15.5)
WBC: 11 10*3/uL — ABNORMAL HIGH (ref 4.0–10.5)

## 2015-11-25 LAB — MAGNESIUM: MAGNESIUM: 1.7 mg/dL (ref 1.7–2.4)

## 2015-11-25 LAB — BASIC METABOLIC PANEL
ANION GAP: 8 (ref 5–15)
BUN: 10 mg/dL (ref 6–20)
CALCIUM: 9.5 mg/dL (ref 8.9–10.3)
CO2: 28 mmol/L (ref 22–32)
Chloride: 102 mmol/L (ref 101–111)
Creatinine, Ser: 0.65 mg/dL (ref 0.44–1.00)
GFR calc Af Amer: >60 mL/min (ref >60)
GLUCOSE: 179 mg/dL — AB (ref 65–99)
Potassium: 3.9 mmol/L (ref 3.5–5.1)
Sodium: 138 mmol/L (ref 135–145)

## 2015-11-25 LAB — LIPID PANEL
CHOL/HDL RATIO: 3.3 ratio
CHOLESTEROL: 139 mg/dL (ref 0–200)
HDL: 42 mg/dL (ref >40)
LDL CALC: 65 mg/dL (ref 0–99)
TRIGLYCERIDES: 158 mg/dL — AB (ref <150)
VLDL: 32 mg/dL (ref 0–40)

## 2015-11-25 MED ORDER — KETOROLAC TROMETHAMINE 30 MG/ML IJ SOLN
30.0000 mg | Freq: Four times a day (QID) | INTRAMUSCULAR | Status: DC | PRN
  Administered 2015-11-25 – 2015-11-26 (×3): 30 mg via INTRAVENOUS
  Filled 2015-11-25 (×3): qty 1

## 2015-11-25 MED ORDER — MAGNESIUM SULFATE 50 % IJ SOLN
3.0000 g | Freq: Once | INTRAMUSCULAR | Status: AC
  Administered 2015-11-25: 3 g via INTRAVENOUS
  Filled 2015-11-25: qty 6

## 2015-11-25 MED ORDER — GADOBENATE DIMEGLUMINE 529 MG/ML IV SOLN: 15.0000 mL | Freq: Once | INTRAVENOUS | Status: DC | PRN

## 2015-11-25 MED ORDER — LORAZEPAM 2 MG/ML IJ SOLN
1.0000 mg | Freq: Three times a day (TID) | INTRAMUSCULAR | Status: DC | PRN
  Administered 2015-11-25: 1 mg via INTRAVENOUS
  Filled 2015-11-25: qty 1

## 2015-11-25 MED ORDER — CLONAZEPAM 0.5 MG PO TABS
1.0000 mg | ORAL_TABLET | Freq: Three times a day (TID) | ORAL | Status: DC | PRN
  Administered 2015-11-25 – 2015-11-27 (×7): 1 mg via ORAL
  Filled 2015-11-25 (×7): qty 2

## 2015-11-25 NOTE — Progress Notes (Signed)
PT Cancellation Note  Patient Details Name: Joan Newman MRN: [id number] DOB: [birth year]   Cancelled Treatment:    Reason Eval/Treat Not Completed: PT screened, no needs identified, will sign off -- Pt is independent with basic mobility and remains appropriate for ALF or home.  Pt has concerns about cognitive function/memory and was placed in ALF 7/28 per [facility] CSW.  Pt states she is not happy at this facility [facility]).  Concerns shared with CSW on call and they will follow up with her.  No PT needs at this time.  Thank you,   Dennis Bast 11/25/2015, 3:57 PM

## 2015-11-25 NOTE — Progress Notes (Signed)
PATIENT ID: 55 year old woman with CAD status post NSTEMI (DES-->LAD 2011), hypertension, COPD, seizures, recent SAH and tobacco abuse here with chest pain.   SUBJECTIVE:  Continues to have chest pain and headaches.   PHYSICAL EXAM Vitals:   11/25/15 0500 11/25/15 0528 11/25/15 0845 11/25/15 0901  BP:  (!) 101/45 (!) 112/43   Pulse:  (!) 57 62   Resp:  16    Temp:  97.7 F (36.5 C)    TempSrc:  Oral    SpO2:  96%  97%  Weight: 156 lb 11.2 oz (71.1 kg)     Height:       General:  Well appearing. No respiratory difficulty HEENT: Bilateral conjunctival injection with appointment in place Neck: supple. no JVD. Carotids 2+ bilat; no bruits. No lymphadenopathy or thryomegaly appreciated. Cor: PMI nondisplaced. Regular rate & rhythm. No rubs, gallops or murmurs. Lungs: clear Abdomen: soft, nontender, nondistended. No hepatosplenomegaly. No bruits or masses. Good bowel sounds. Extremities: no cyanosis, clubbing, rash, edema Neuro: alert & oriented x 3, cranial nerves grossly intact. moves all 4 extremities w/o difficulty. Affect pleasant.  LABS: Lab Results  Component Value Date   TROPONINI <0.03 11/24/2015   Results for orders placed or performed during the hospital encounter of 11/23/15 (from the past 24 hour(s))  Lipid panel     Status: Abnormal   Collection Time: 11/25/15  4:59 AM  Result Value Ref Range   Cholesterol 139 0 - 200 mg/dL   Triglycerides 914 (H) <150 mg/dL   HDL 42 >78 mg/dL   Total CHOL/HDL Ratio 3.3 RATIO   VLDL 32 0 - 40 mg/dL   LDL Cholesterol 65 0 - 99 mg/dL  Basic metabolic panel     Status: Abnormal   Collection Time: 11/25/15  4:59 AM  Result Value Ref Range   Sodium 138 135 - 145 mmol/L   Potassium 3.9 3.5 - 5.1 mmol/L   Chloride 102 101 - 111 mmol/L   CO2 28 22 - 32 mmol/L   Glucose, Bld 179 (H) 65 - 99 mg/dL   BUN 10 6 - 20 mg/dL   Creatinine, Ser 2.95 0.44 - 1.00 mg/dL   Calcium 9.5 8.9 - 62.1 mg/dL   GFR calc non Af Amer >60 >60 mL/min     GFR calc Af Amer >60 >60 mL/min   Anion gap 8 5 - 15  CBC     Status: Abnormal   Collection Time: 11/25/15  4:59 AM  Result Value Ref Range   WBC 11.0 (H) 4.0 - 10.5 K/uL   RBC 4.29 3.87 - 5.11 MIL/uL   Hemoglobin 14.1 12.0 - 15.0 g/dL   HCT 30.8 65.7 - 84.6 %   MCV 101.4 (H) 78.0 - 100.0 fL   MCH 32.9 26.0 - 34.0 pg   MCHC 32.4 30.0 - 36.0 g/dL   RDW 96.2 95.2 - 84.1 %   Platelets 280 150 - 400 K/uL  Magnesium     Status: None   Collection Time: 11/25/15  4:59 AM  Result Value Ref Range   Magnesium 1.7 1.7 - 2.4 mg/dL    Intake/Output Summary (Last 24 hours) at 11/25/15 1235 Last data filed at 11/25/15 0857  Gross per 24 hour  Intake              960 ml  Output             1400 ml  Net             -  440 ml    Telemetry:  Sinus bradycardia and sinus rhythm  ASSESSMENT AND PLAN:  Principal Problem:   Pain in the chest Active Problems:   Chronic obstructive pulmonary disease (HCC)   Essential hypertension   Tobacco use disorder   CAD (coronary artery disease)   Hyperlipidemia   Anxiety   Depression   Seizures (HCC)   Subarachnoid bleed [facility])   # Chest pain: # CAD s/p NSTEMI 2011: Ms. Kuehl continues to have intermittent episodes of chest pain. However, cardiac enzymes are negative and her EKG is unchanged from prior. Cardiac enzymes remained negative. She had a stress test last month that was reportedly negative for ischemia.  Echo pending.  Given the fact that she had a recent subarachnoid hemorrhage, would not pursue cardiac catheterization at this time.  If the echo is normal we will not proceed with stress testing, as she had a recent stress test. Continue with conservative management. Her PPI was recently restarted. however, she continues to have some symptoms of gastroesophageal reflux disease. Her chest pain could be due to esophageal spasm. Continue aspirin, atorvastatin and  metorpolol.  Imdur started this admission.  # Hypertension: BP well-controlled on  metoprolol and lisinopril.   # Hyperlipidemia: Continue atorvastatin.  LDL 65.  # Recent SAH: Per IM.   Perlita Forbush C. Duke Salvia, MD, [facility] 11/25/2015 12:35 PM

## 2015-11-25 NOTE — Progress Notes (Signed)
PROGRESS NOTE    Joan Newman  JZP:[id number] DOB: 1961/03/03 DOA: 11/23/2015 PCP: Lonie Peak, PA-C   Brief Narrative:  Joan Newman is a 55 y.o. female with medical history significant of anxiety, depression, coronary artery disease,  NSTEMI in 2011 s/p DES to LAD, hypertension,  COPD, tobacco use disorder who was discharged from [facility] to [facility] care following an admission for seizures and subarachnoid hemorrhage on 11/07/2015.  Per patient, around 1300, 1 1/2 hours after being discharged from [facility] and arriving to the [facility] care facility, she experienced precordial chest pain while sitting in bed, stabbing like, radiating to her left shoulder and back associated with dyspnea, mild diaphoresis, nausea and palpitations. The patient rated her pain at 10/10 at that point. She denies pitting edema of the lower extremities, orthopnea or PND.  EMS was subsequently called. She received 324 mg of aspirin and sublingual nitroglycerin which decreased the pain to about 7/10. She was started on nitro paste in the emergency department which has decreased the discomfort further, but still complains of some chest pain. However she states that she has a significant headache now (has been having headache previously, but it seems that nitroglycerin has worsened it).  She denies fever, chills, abdominal pain, diarrhea, constipation, dysuria or hematuria. . She states that last month she was admitted for chest pain at [facility] and underwent a Myoview stress test, which apparently was negative. However, I am unable to see those results on the care everywhere tab as its assigned form shows her discharge summary instead.   ED Course: The patient was given on Nitropaste reporting partial relief. Workup shows EKG with inverted T waves on anterolateral leads, mild leukocytosis of 12 K, and hyperglycemia 128 mg/dL.   Assessment & Plan:   Principal Problem:   Pain in the chest Active  Problems:   Chronic obstructive pulmonary disease (HCC)   Essential hypertension   Tobacco use disorder   CAD (coronary artery disease)   Hyperlipidemia   Anxiety   Depression   Seizures (HCC)   Subarachnoid bleed (HCC)   #1 chest pain Patient presenting with persistent chest pain intermittent in nature worse on exertion. Patient with prior history of coronary artery disease status post  NSTEMI 2011. Cardiac enzymes negative 2. EKG with T-wave inversions in V2, V4 through V6. 2-D echo pending. Patient would recent subarachnoid hemorrhage and a such cannot be placed on IV heparin. Patient with nitroglycerin paste/patch on. Patient has been seen by cardiology were recommending conservative management at this time. If 2-D echo is normal no need for stress testing per cardiology. Concern for possible esophageal spasms. GI cocktail as needed. Continue aspirin, Lipitor, lisinopril, metoprolol. Increased PPI to twice daily. Discontinued Nitropatch/paste due to worsening headache and placed on Imdur 30 mg daily. Discontinue morphine. Toradol as needed. Cardiology following and appreciate input and recommendations.   #2 hypertension Stable. Continue current regimen of Lopressor, lisinopril.  #3 hyperlipidemia Fasting lipid panel with LDL of 65. Continue statin.  #4 seizures Stable. No seizures noted during this hospitalization. Continue Keppra.  #5 recent subarachnoid hemorrhage Patient would recent CT head 11/21/2015 showing marked improvement and subcortical white matter hypoattenuation over the posterior parietal and frontal lobes compatible with resolving PRES. expected evolution of anterior left frontal lobe subarachnoid hemorrhage. No new hemorrhage or progression. No other acute intracranial abnormality. Patient with complaints of headache and a such will get a MRI MRA of the head. Outpatient follow-up with neurosurgery.  #6 headache Likely multifactorial secondary to  recent subarachnoid  hemorrhage and nitroglycerin paste. Nitroglycerin paste discontinued however patient still with headache. Will discontinue IV morphine and place on IV Toradol as needed. MRI/MRA of the head. Follow.   #7 depression/anxiety Stable. Continue Klonopin as needed.     DVT prophylaxis: SCDs Code Status: Full Family Communication: Updated patient. No family at bedside. Disposition Plan: Home when medically stable and per cardiology.   Consultants:   Cardiology: Dr. Duke Salvia 11/24/2015  Procedures:   Chest x-ray 11/23/2015  2-D echo pending  Antimicrobials:   None   Subjective: Patient with complaints of chest pain and headache.   Objective: Vitals:   11/25/15 0528 11/25/15 0845 11/25/15 0901 11/25/15 1449  BP: (!) 101/45 (!) 112/43  (!) 108/55  Pulse: (!) 57 62  (!) 53  Resp: 16   14  Temp: 97.7 F (36.5 C)   98.1 F (36.7 C)  TempSrc: Oral   Oral  SpO2: 96%  97% 96%  Weight:      Height:        Intake/Output Summary (Last 24 hours) at 11/25/15 1608 Last data filed at 11/25/15 0857  Gross per 24 hour  Intake              960 ml  Output             1200 ml  Net             -240 ml   Filed Weights   11/23/15 2040 11/24/15 0458 11/25/15 0500  Weight: 71.4 kg (157 lb 8 oz) 71.4 kg (157 lb 4.8 oz) 71.1 kg (156 lb 11.2 oz)    Examination:  General exam: Appears calm and comfortable  Respiratory system: Clear to auscultation. Respiratory effort normal. Cardiovascular system: S1 & S2 heard, RRR. No JVD, murmurs, rubs, gallops or clicks. No pedal edema. Gastrointestinal system: Abdomen is nondistended, soft and nontender. No organomegaly or masses felt. Normal bowel sounds heard. Central nervous system: Alert and oriented. No focal neurological deficits. Extremities: Symmetric 5 x 5 power. Skin: No rashes, lesions or ulcers Psychiatry: Judgement and insight appear normal. Mood & affect appropriate.     Data Reviewed: I have personally reviewed following labs  and imaging studies  CBC:  Recent Labs Lab 11/23/15 1535 11/24/15 0319 11/25/15 0459  WBC 12.0* 9.6 11.0*  NEUTROABS  --  5.6  --   HGB 14.4 13.8 14.1  HCT 44.0 42.9 43.5  MCV 100.0 100.7* 101.4*  PLT 282 265 280   Basic Metabolic Panel:  Recent Labs Lab 11/23/15 1535 11/24/15 0319 11/25/15 0459  NA 138 138 138  K 4.3 4.0 3.9  CL 101 103 102  CO2 27 29 28   GLUCOSE 128* 129* 179*  BUN 8 11 10   CREATININE 0.57 0.54 0.65  CALCIUM 9.3 9.0 9.5  MG  --   --  1.7   GFR: Estimated Creatinine Clearance: 72.5 mL/min (by C-G formula based on SCr of 0.8 mg/dL). Liver Function Tests:  Recent Labs Lab 11/24/15 0319  AST 17  ALT 20  ALKPHOS 76  BILITOT 0.6  PROT 6.1*  ALBUMIN 3.1*   No results for input(s): LIPASE, AMYLASE in the last 168 hours. No results for input(s): AMMONIA in the last 168 hours. Coagulation Profile: No results for input(s): INR, PROTIME in the last 168 hours. Cardiac Enzymes:  Recent Labs Lab 11/23/15 2131 11/24/15 0319  TROPONINI <0.03 <0.03   BNP (last 3 results) No results for input(s): PROBNP in the last 8760  hours. HbA1C: No results for input(s): HGBA1C in the last 72 hours. CBG: No results for input(s): GLUCAP in the last 168 hours. Lipid Profile:  Recent Labs  11/25/15 0459  CHOL 139  HDL 42  LDLCALC 65  TRIG 158*  CHOLHDL 3.3   Thyroid Function Tests: No results for input(s): TSH, T4TOTAL, FREET4, T3FREE, THYROIDAB in the last 72 hours. Anemia Panel: No results for input(s): VITAMINB12, FOLATE, FERRITIN, TIBC, IRON, RETICCTPCT in the last 72 hours. Sepsis Labs: No results for input(s): PROCALCITON, LATICACIDVEN in the last 168 hours.  No results found for this or any previous visit (from the past 240 hour(s)).       Radiology Studies: Dg Chest 2 View  Result Date: 11/23/2015 CLINICAL DATA:  Left-sided chest pain this morning. EXAM: CHEST  2 VIEW COMPARISON:  11/07/2015 FINDINGS: Heart is mildly enlarged. Lungs  are clear. No effusions or edema. No acute bony abnormality. IMPRESSION: Mild cardiomegaly.  No active disease. Electronically Signed   By: Charlett Nose M.D.   On: 11/23/2015 16:42  Mr Maxine Glenn Head Wo Contrast  Result Date: 11/25/2015 CLINICAL DATA:  Headache and chest pain, worse after recent administration of nitroglycerin. Resolving PRES. EXAM: MRI HEAD WITHOUT CONTRAST MRA HEAD WITHOUT CONTRAST TECHNIQUE: Multiplanar, multiecho pulse sequences of the brain and surrounding structures were obtained without intravenous contrast. Angiographic images of the head were obtained using MRA technique without contrast. COMPARISON:  MRI brain and MRA intracranial most recent 11/10/2015. CT head 11/21/2015. FINDINGS: MRI HEAD FINDINGS Previous changes of restricted diffusion, as well as T2 and FLAIR hyperintensity in the parieto-occipital lobes shown significant improvement. No restriction is present, whereas white matter changes are minimal. No chronic or acute hemorrhage. Small volume subarachnoid hemorrhage, LEFT frontal sulci, is redemonstrated but improved as well. No acute stroke, mass lesion, hydrocephalus, or new significant finding from 07/15. MRA HEAD FINDINGS Internal carotid arteries are widely patent. The basilar artery is mildly hypoplastic due to fetal LEFT PCA. LEFT vertebral is the dominant/sole contributor. Moderately diseased A1 ACA on the LEFT. Azygos A2 segment proximally. Mildly irregular M1 MCA segments bilaterally. No intracranial aneurysm. Similar appearance to prior MRA. IMPRESSION: Improved, near normalized, intracranial sequelae of PRES. Residual subarachnoid blood over the LEFT frontal convexity, is also improved. No acute intracranial findings. No abnormal postcontrast enhancement. Stable intracranial MRA appearance. Electronically Signed   By: Elsie Stain M.D.   On: 11/25/2015 15:17  Mr Laqueta Jean ZO Contrast  Result Date: 11/25/2015 CLINICAL DATA:  Headache and chest pain, worse after  recent administration of nitroglycerin. Resolving PRES. EXAM: MRI HEAD WITHOUT CONTRAST MRA HEAD WITHOUT CONTRAST TECHNIQUE: Multiplanar, multiecho pulse sequences of the brain and surrounding structures were obtained without intravenous contrast. Angiographic images of the head were obtained using MRA technique without contrast. COMPARISON:  MRI brain and MRA intracranial most recent 11/10/2015. CT head 11/21/2015. FINDINGS: MRI HEAD FINDINGS Previous changes of restricted diffusion, as well as T2 and FLAIR hyperintensity in the parieto-occipital lobes shown significant improvement. No restriction is present, whereas white matter changes are minimal. No chronic or acute hemorrhage. Small volume subarachnoid hemorrhage, LEFT frontal sulci, is redemonstrated but improved as well. No acute stroke, mass lesion, hydrocephalus, or new significant finding from 07/15. MRA HEAD FINDINGS Internal carotid arteries are widely patent. The basilar artery is mildly hypoplastic due to fetal LEFT PCA. LEFT vertebral is the dominant/sole contributor. Moderately diseased A1 ACA on the LEFT. Azygos A2 segment proximally. Mildly irregular M1 MCA segments bilaterally. No intracranial aneurysm. Similar  appearance to prior MRA. IMPRESSION: Improved, near normalized, intracranial sequelae of PRES. Residual subarachnoid blood over the LEFT frontal convexity, is also improved. No acute intracranial findings. No abnormal postcontrast enhancement. Stable intracranial MRA appearance. Electronically Signed   By: Elsie Stain M.D.   On: 11/25/2015 15:17       Scheduled Meds: . aspirin  81 mg Oral Daily  . atorvastatin  80 mg Oral QPM  . gentamicin ointment   Topical TID  . isosorbide mononitrate  30 mg Oral Daily  . levETIRAcetam  500 mg Oral BID  . lisinopril  20 mg Oral BID  . metoprolol tartrate  25 mg Oral BID  . mometasone-formoterol  2 puff Inhalation BID  . pantoprazole  40 mg Oral BID AC  . potassium chloride SA  60 mEq  Oral Daily  . sodium chloride flush  3 mL Intravenous Q12H   Continuous Infusions:    LOS: 0 days    Time spent: 40 mins    THOMPSON,DANIEL, MD [facility] Pager [phone]  If 7PM-7AM, please contact night-coverage [email or web address] Password [email or web address] 11/25/2015, 4:08 PM

## 2015-11-26 ENCOUNTER — Inpatient Hospital Stay (HOSPITAL_COMMUNITY): Payer: Self-pay

## 2015-11-26 DIAGNOSIS — R079 Chest pain, unspecified: Secondary | ICD-10-CM

## 2015-11-26 DIAGNOSIS — R0789 Other chest pain: Secondary | ICD-10-CM

## 2015-11-26 DIAGNOSIS — F329 Major depressive disorder, single episode, unspecified: Secondary | ICD-10-CM

## 2015-11-26 LAB — ECHOCARDIOGRAM COMPLETE
HEIGHTINCHES: 61 in
WEIGHTICAEL: 2529.6 [oz_av]

## 2015-11-26 LAB — BASIC METABOLIC PANEL
ANION GAP: 8 (ref 5–15)
BUN: 12 mg/dL (ref 6–20)
CHLORIDE: 101 mmol/L (ref 101–111)
CO2: 27 mmol/L (ref 22–32)
Calcium: 8.9 mg/dL (ref 8.9–10.3)
Creatinine, Ser: 0.77 mg/dL (ref 0.44–1.00)
GFR calc Af Amer: >60 mL/min (ref >60)
Glucose, Bld: 187 mg/dL — ABNORMAL HIGH (ref 65–99)
POTASSIUM: 4.2 mmol/L (ref 3.5–5.1)
SODIUM: 136 mmol/L (ref 135–145)

## 2015-11-26 LAB — CBC
HCT: 43.9 % (ref 36.0–46.0)
HEMOGLOBIN: 14.2 g/dL (ref 12.0–15.0)
MCH: 32.8 pg (ref 26.0–34.0)
MCHC: 32.3 g/dL (ref 30.0–36.0)
MCV: 101.4 fL — AB (ref 78.0–100.0)
PLATELETS: 243 10*3/uL (ref 150–400)
RBC: 4.33 MIL/uL (ref 3.87–5.11)
RDW: 13 % (ref 11.5–15.5)
WBC: 8.6 10*3/uL (ref 4.0–10.5)

## 2015-11-26 LAB — MAGNESIUM: Magnesium: 2 mg/dL (ref 1.7–2.4)

## 2015-11-26 MED ORDER — TRAMADOL HCL 50 MG PO TABS
100.0000 mg | ORAL_TABLET | Freq: Four times a day (QID) | ORAL | Status: DC | PRN
  Administered 2015-11-26 – 2015-11-27 (×3): 100 mg via ORAL
  Filled 2015-11-26 (×3): qty 2

## 2015-11-26 MED ORDER — IBUPROFEN 800 MG PO TABS
800.0000 mg | ORAL_TABLET | Freq: Four times a day (QID) | ORAL | Status: DC | PRN
Start: 2015-11-26 — End: 2015-11-27
  Administered 2015-11-26 – 2015-11-27 (×3): 800 mg via ORAL
  Filled 2015-11-26 (×3): qty 1

## 2015-11-26 MED ORDER — TOBRAMYCIN 0.3 % OP OINT
TOPICAL_OINTMENT | Freq: Three times a day (TID) | OPHTHALMIC | Status: DC
  Administered 2015-11-26 – 2015-11-27 (×2): via OPHTHALMIC
  Filled 2015-11-26: qty 3.5

## 2015-11-26 NOTE — Progress Notes (Signed)
PATIENT ID: 55 year old woman with CAD status post NSTEMI (DES-->LAD 2011), hypertension, COPD, seizures, recent SAH and tobacco abuse here with chest pain.   SUBJECTIVE:  Continues to have   headaches.   PHYSICAL EXAM Vitals:   11/25/15 2019 11/26/15 0538 11/26/15 0809 11/26/15 0835  BP:  (!) 131/54 (!) 121/49   Pulse:  73 67   Resp:  16    Temp:  97.7 F (36.5 C)    TempSrc:  Oral    SpO2: 97% 98%  98%  Weight:  158 lb 1.6 oz (71.7 kg)    Height:       General:  Well appearing. No respiratory difficulty,   HEENT: Bilateral conjunctival injection with appointment in place Neck: supple. no JVD. Carotids 2+ bilat; no bruits. No lymphadenopathy or thryomegaly appreciated. Cor: PMI nondisplaced. Regular rate & rhythm. No rubs, gallops or murmurs. Lungs: clear Abdomen: soft, nontender, nondistended. No hepatosplenomegaly. No bruits or masses. Good bowel sounds. Extremities: no cyanosis, clubbing, rash, edema Neuro: alert & oriented x 3, cranial nerves grossly intact. moves all 4 extremities w/o difficulty. Affect pleasant.  LABS: Lab Results  Component Value Date   TROPONINI <0.03 11/24/2015   Results for orders placed or performed during the hospital encounter of 11/23/15 (from the past 24 hour(s))  Magnesium     Status: None   Collection Time: 11/26/15  5:24 AM  Result Value Ref Range   Magnesium 2.0 1.7 - 2.4 mg/dL  CBC     Status: Abnormal   Collection Time: 11/26/15  5:24 AM  Result Value Ref Range   WBC 8.6 4.0 - 10.5 K/uL   RBC 4.33 3.87 - 5.11 MIL/uL   Hemoglobin 14.2 12.0 - 15.0 g/dL   HCT 14.7 82.9 - 56.2 %   MCV 101.4 (H) 78.0 - 100.0 fL   MCH 32.8 26.0 - 34.0 pg   MCHC 32.3 30.0 - 36.0 g/dL   RDW 13.0 86.5 - 78.4 %   Platelets 243 150 - 400 K/uL  Basic metabolic panel     Status: Abnormal   Collection Time: 11/26/15  5:24 AM  Result Value Ref Range   Sodium 136 135 - 145 mmol/L   Potassium 4.2 3.5 - 5.1 mmol/L   Chloride 101 101 - 111 mmol/L   CO2  27 22 - 32 mmol/L   Glucose, Bld 187 (H) 65 - 99 mg/dL   BUN 12 6 - 20 mg/dL   Creatinine, Ser 6.96 0.44 - 1.00 mg/dL   Calcium 8.9 8.9 - 29.5 mg/dL   GFR calc non Af Amer >60 >60 mL/min   GFR calc Af Amer >60 >60 mL/min   Anion gap 8 5 - 15    Intake/Output Summary (Last 24 hours) at 11/26/15 0931 Last data filed at 11/26/15 0857  Gross per 24 hour  Intake              720 ml  Output              900 ml  Net             -180 ml    Telemetry:  Sinus bradycardia and sinus rhythm  ASSESSMENT AND PLAN:  Principal Problem:   Pain in the chest Active Problems:   Chronic obstructive pulmonary disease (HCC)   Essential hypertension   Tobacco use disorder   CAD (coronary artery disease)   Hyperlipidemia   Anxiety   Depression   Seizures (HCC)   Subarachnoid bleed (  HCC)   Headache   Chest pain   # Chest pain: # CAD s/p NSTEMI 2011: Joan Newman seems to be feeling better. No recent CP Had a stress myoview a month ago. Echo is scheduled for today  Agree with Dr. Duke Salvia that if the echo is normal , I do not think she needs any further cardiac evaluation .   # Hypertension: BP well-controlled on metoprolol and lisinopril.   # Hyperlipidemia: Continue atorvastatin.  LDL 65.  # Recent SAH: Per IM.    Kristeen Miss, MD  11/26/2015 9:43 AM    [facility] [address] [place],  [address] [place], [place]  [address] Pager [phone] Phone: [phone]; Fax: [phone]

## 2015-11-26 NOTE — Progress Notes (Signed)
Occupational Therapy Evaluation Patient Details Name: Joan Newman MRN: [id number] DOB: 10-12-1960 Today's Date: 11/26/2015    History of Present Illness Pt admitted on 11/07/15 with seizure activity and SAH. PMH which includes HTN, HLD, COPD, and Anxiety. Records from OSH also report PMH including MI, CAD s/p DES to LAD   Clinical Impression   Prior to previous admission, pt was independent with ADLs and mobility, but reports difficulty with bathing and required transportation for appointments/errands. Pt currently presents with cognitive deficits including short-term memory and slow processing in addition to mild balance deficits. Provided pt with information on [employer] (PACE) program near her home as I feel she could greatly benefit from the services they can provide especially with her medication, financial, and social needs. Will continue to follow acutely to address pt's cognitive concerns.    Follow Up Recommendations  No OT follow up;Supervision - Intermittent    Equipment Recommendations  Tub/shower bench; 3in1; Other (comment) (medical alert system) - if pt returns home   Recommendations for Other Services       Precautions / Restrictions Precautions Precautions: None Restrictions Weight Bearing Restrictions: No      Mobility Bed Mobility Overal bed mobility: Modified Independent                Transfers Overall transfer level: Needs assistance Equipment used: None Transfers: Sit to/from Stand Sit to Stand: Supervision         General transfer comment: Pt reports dizziness upon standing at times especially after taking medications.. No dizziness reported or LOB noted    Balance Overall balance assessment: Needs assistance Sitting-balance support: No upper extremity supported;Feet supported Sitting balance-Leahy Scale: Normal     Standing balance support: No upper extremity supported;During functional activity Standing  balance-Leahy Scale: Good                              ADL Overall ADL's : Needs assistance/impaired         Upper Body Bathing: Modified independent   Lower Body Bathing: Modified independent   Upper Body Dressing : Modified independent   Lower Body Dressing: Modified independent   Toilet Transfer: Supervision/safety;Ambulation;Regular Toilet;Grab bars   Toileting- Clothing Manipulation and Hygiene: Supervision/safety;Sit to/from stand       Functional mobility during ADLs: Supervision/safety General ADL Comments: Pt voiced concerns about her memory  since seizures occurred.     Vision Vision Assessment?: Vision impaired- to be further tested in functional context   Perception     Praxis      Pertinent Vitals/Pain Pain Assessment: 0-10 Pain Score: 5  Pain Location: headache Pain Descriptors / Indicators: Headache Pain Intervention(s): Premedicated before session;Monitored during session     Hand Dominance Right   Extremity/Trunk Assessment Upper Extremity Assessment Upper Extremity Assessment: Overall WFL for tasks assessed   Lower Extremity Assessment Lower Extremity Assessment: Overall WFL for tasks assessed   Cervical / Trunk Assessment Cervical / Trunk Assessment: Normal   Communication Communication Communication: No difficulties   Cognition Arousal/Alertness: Awake/alert Behavior During Therapy: WFL for tasks assessed/performed Overall Cognitive Status: Impaired/Different from baseline Area of Impairment: Memory;Problem solving     Memory: Decreased short-term memory       Problem Solving: Slow processing     General Comments       Exercises       Shoulder Instructions      Home Living Family/patient expects to be discharged to:: Private  residence Living Arrangements: Alone Available Help at Discharge: Family;Available PRN/intermittently Type of Home: Mobile home Home Access: Stairs to enter Entrance Stairs-Number  of Steps: 4 Entrance Stairs-Rails: Right Home Layout: One level     Bathroom Shower/Tub: Tub/shower unit;Tub only Shower/tub characteristics: Engineer, building services: Standard     Home Equipment: None   Additional Comments: Pt's mother was checking up on her x1/week, but has recently been admitted to hospital and now is on home O2 and has limited driving privileges. Pt's brother is legally blind and unable to assist and her daughters do not live locally.      Prior Functioning/Environment Level of Independence: Needs assistance  Gait / Transfers Assistance Needed: Independent ADL's / Homemaking Assistance Needed: Independent with ADLs, required transportation for all appointments and errands - due to lack of transportation and financial issues pt could not consistently get her medication or food        OT Diagnosis: Cognitive deficits;Acute pain;Disturbance of vision   OT Problem List: Decreased activity tolerance;Impaired balance (sitting and/or standing);Decreased cognition;Decreased knowledge of use of DME or AE;Pain;Obesity   OT Treatment/Interventions: Self-care/ADL training;Therapeutic exercise;DME and/or AE instruction;Therapeutic activities;Patient/family education;Balance training;Cognitive remediation/compensation    OT Goals(Current goals can be found in the care plan section) Acute Rehab OT Goals Patient Stated Goal: to not go back to [facility], but go somewhere else and be safe OT Goal Formulation: With patient Time For Goal Achievement: 12/10/15 Potential to Achieve Goals: Good ADL Goals Pt Will Perform Grooming: with modified independence;standing Pt Will Transfer to Toilet: with modified independence;ambulating;bedside commode Pt Will Perform Toileting - Clothing Manipulation and hygiene: with modified independence;sit to/from stand Pt Will Perform Tub/Shower Transfer: Tub transfer;ambulating;tub bench Additional ADL Goal #1: Pt will verbalize 2 home safety  strategies to increase safety at home.  OT Frequency: Min 2X/week   Barriers to D/C: Decreased caregiver support  Lives alone       Co-evaluation              End of Session Nurse Communication: Mobility status  Activity Tolerance: Patient tolerated treatment well Patient left: in bed;with call bell/phone within reach   Time: 1357-1422 OT Time Calculation (min): 25 min Charges:  OT General Charges $OT Visit: 1 Procedure OT Evaluation $OT Eval Moderate Complexity: 1 Procedure OT Treatments $Self Care/Home Management : 8-22 mins G-Codes:    Nils Pyle, OTR/L Pager: [phone] 11/26/2015, 2:56 PM
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
## 2015-11-26 NOTE — Care Management Note (Addendum)
Case Management Note  Patient Details  Name: Joan Newman MRN: [id number] Date of Birth: October 15, 1960  Subjective/Objective: Pt presented for Chest Pain. Pt is from ALF via LOG.                    Action/Plan: CSW assisting with disposition needs. Pt may choose to d/c home rather than return to ALF. CM will continue to monitor for additional needs.    Expected Discharge Date:                  Expected Discharge Plan:  Assisted Living / Rest Home  In-House Referral:  Clinical Social Work  Discharge planning Services  CM Consult  Post Acute Care Choice:   Home with self care, Durable Medical Equipment Choice offered to:     DME Arranged:   RW, 3n1 and tubchair DME Agency:   [facility]  HH Arranged:   N/A HH Agency:   N/A  Status of Service:Completed If discussed at [employer] of Stay Meetings, dates discussed:    Additional Comments: Pt plan for d/c today. Pt is refusing to return to [facility]. CM did stress to pt that returning to [facility] would benefit her at this time with the rehab services and the availability of medications. Pt still refusing to return. Pt has no insurance and PCP is [facility]. Pt uses [facility] did fax Rx's to Pharmacy and pt will be able to get medications for free. CM spoke to pt in regards to transportation- she wanted to utilize ambulance transport and CM did state to pt she will receive a bill. Pt did find a ride home via her cousin. DME was ordered via [facility] for 3n1, tub chair and RW. DME delivered to room before d/c. Pt's belongings at [facility]. Her cousin to transport her to pick up items. CM did call around to several [facility] agencies for [facility]: No agency was able to assist at this time for self pay patient's. Per agencies they had met quota and some did not provide care for self pay patients. Tommi Rumps with [facility] did assist with information to [facility]. CM did call and spoke with  [facility]. Pt has an appointment this Thursday Aug 3rd at 1pm with FNP- Zara Council. Transportation arranged by this CM with [facility]. Transportation to pick her up at 12:15. Pt is aware of pick up times and items to carry to appointment. CM did fax [place] orders for Constance Holster, because she works closely with [facility] for [facility]. CM did call this agency however they were unable to assist. Pt will be d/c without any home care services. Pt is aware that she will need to make the f/u appointment for additional assistance with getting home care established. No further needs from this CM. Jacqlyn Krauss, RN,BSN [phone]   Bethena Roys, RN 11/26/2015, 1:54 PM

## 2015-11-26 NOTE — Progress Notes (Signed)
PROGRESS NOTE    Joan Newman  WJX:[id number] DOB: [birth year] DOA: 11/23/2015 PCP: Lonie Peak, PA-C   Brief Narrative:  Joan Newman is a 55 y.o. female with medical history significant of anxiety, depression, coronary artery disease,  NSTEMI in 2011 s/p DES to LAD, hypertension,  COPD, tobacco use disorder who was discharged from [facility] to [facility] care following an admission for seizures and subarachnoid hemorrhage on 11/07/2015.  Per patient, around 1300, 1 1/2 hours after being discharged from [facility] and arriving to the [facility] care facility, she experienced precordial chest pain while sitting in bed, stabbing like, radiating to her left shoulder and back associated with dyspnea, mild diaphoresis, nausea and palpitations. The patient rated her pain at 10/10 at that point. She denies pitting edema of the lower extremities, orthopnea or PND.  EMS was subsequently called. She received 324 mg of aspirin and sublingual nitroglycerin which decreased the pain to about 7/10. She was started on nitro paste in the emergency department which has decreased the discomfort further, but still complains of some chest pain. However she states that she has a significant headache now (has been having headache previously, but it seems that nitroglycerin has worsened it).  She denies fever, chills, abdominal pain, diarrhea, constipation, dysuria or hematuria. . She states that last month she was admitted for chest pain at [facility] and underwent a Myoview stress test, which apparently was negative. However, I am unable to see those results on the care everywhere tab as its assigned form shows her discharge summary instead.   ED Course: The patient was given on Nitropaste reporting partial relief. Workup shows EKG with inverted T waves on anterolateral leads, mild leukocytosis of 12 K, and hyperglycemia 128 mg/dL.   Assessment & Plan:   Principal Problem:   Pain in the chest Active  Problems:   Chronic obstructive pulmonary disease (HCC)   Essential hypertension   Tobacco use disorder   CAD (coronary artery disease)   Hyperlipidemia   Anxiety   Depression   Seizures (HCC)   Subarachnoid bleed (HCC)   Headache   Chest pain   #1 chest pain Patient presenting with persistent chest pain intermittent in nature worse on exertion. Patient with prior history of coronary artery disease status post  NSTEMI 2011. Cardiac enzymes negative 2. EKG with T-wave inversions in V2, V4 through V6. 2-D echo pending. Improving clinically. Patient with recent subarachnoid hemorrhage and a such cannot be placed on IV heparin. Patient with nitroglycerin paste/patch on. Patient has been seen by cardiology were recommending conservative management at this time. If 2-D echo is normal no need for stress testing per cardiology. Concern for possible esophageal spasms. GI cocktail as needed. Continue aspirin, Lipitor, lisinopril, metoprolol. Increased PPI to twice daily. Discontinued Nitropatch/paste due to worsening headache and placed on Imdur 30 mg daily. Discontinued morphine. Toradol as needed. Cardiology following and appreciate input and recommendations.   #2 hypertension Stable. Continue current regimen of Lopressor, lisinopril.  #3 hyperlipidemia Fasting lipid panel with LDL of 65. Continue statin.  #4 seizures Stable. No seizures noted during this hospitalization. Continue Keppra.  #5 recent subarachnoid hemorrhage Patient would recent CT head 11/21/2015 showing marked improvement and subcortical white matter hypoattenuation over the posterior parietal and frontal lobes compatible with resolving PRES. expected evolution of anterior left frontal lobe subarachnoid hemorrhage. No new hemorrhage or progression. No other acute intracranial abnormality. Repeat MRI MRA of head improved. Outpatient follow-up with neurosurgery.  #6 headache Likely multifactorial secondary to recent  subarachnoid hemorrhage and nitroglycerin paste. Nitroglycerin paste discontinued however patient still with headache. Headache improved with IV Toradol. MRI MRA of the head improved. Continue IV Toradol as needed. Ibuprofen 800 mg every 6 hours as needed, Ultram 100 mg every 6 hours as needed.    #7 depression/anxiety Stable. Continue Klonopin as needed.     DVT prophylaxis: SCDs Code Status: Full Family Communication: Updated patient. No family at bedside. Disposition Plan: Home when medically stable and per cardiology.   Consultants:   Cardiology: Dr. Duke Salvia 11/24/2015  Procedures:   Chest x-ray 11/23/2015  2-D echo pending  MRI/MRA head 11/25/2015  Antimicrobials:   None   Subjective: Patient states improvement with chest pain. Patient states Toradol helped her headache last night.   Objective: Vitals:   11/25/15 2019 11/26/15 0538 11/26/15 0809 11/26/15 0835  BP:  (!) 131/54 (!) 121/49   Pulse:  73 67   Resp:  16    Temp:  97.7 F (36.5 C)    TempSrc:  Oral    SpO2: 97% 98%  98%  Weight:  71.7 kg (158 lb 1.6 oz)    Height:        Intake/Output Summary (Last 24 hours) at 11/26/15 1118 Last data filed at 11/26/15 0857  Gross per 24 hour  Intake              720 ml  Output              900 ml  Net             -180 ml   Filed Weights   11/24/15 0458 11/25/15 0500 11/26/15 0538  Weight: 71.4 kg (157 lb 4.8 oz) 71.1 kg (156 lb 11.2 oz) 71.7 kg (158 lb 1.6 oz)    Examination:  General exam: Appears calm and comfortable  Respiratory system: Clear to auscultation. Respiratory effort normal. Cardiovascular system: S1 & S2 heard, RRR. No JVD, murmurs, rubs, gallops or clicks. No pedal edema. Gastrointestinal system: Abdomen is nondistended, soft and nontender. No organomegaly or masses felt. Normal bowel sounds heard. Central nervous system: Alert and oriented. No focal neurological deficits. Extremities: Symmetric 5 x 5 power. Skin: No rashes, lesions  or ulcers Psychiatry: Judgement and insight appear normal. Mood & affect appropriate.     Data Reviewed: I have personally reviewed following labs and imaging studies  CBC:  Recent Labs Lab 11/23/15 1535 11/24/15 0319 11/25/15 0459 11/26/15 0524  WBC 12.0* 9.6 11.0* 8.6  NEUTROABS  --  5.6  --   --   HGB 14.4 13.8 14.1 14.2  HCT 44.0 42.9 43.5 43.9  MCV 100.0 100.7* 101.4* 101.4*  PLT 282 265 280 243   Basic Metabolic Panel:  Recent Labs Lab 11/23/15 1535 11/24/15 0319 11/25/15 0459 11/26/15 0524  NA 138 138 138 136  K 4.3 4.0 3.9 4.2  CL 101 103 102 101  CO2 27 29 28 27   GLUCOSE 128* 129* 179* 187*  BUN 8 11 10 12   CREATININE 0.57 0.54 0.65 0.77  CALCIUM 9.3 9.0 9.5 8.9  MG  --   --  1.7 2.0   GFR: Estimated Creatinine Clearance: 72.8 mL/min (by C-G formula based on SCr of 0.8 mg/dL). Liver Function Tests:  Recent Labs Lab 11/24/15 0319  AST 17  ALT 20  ALKPHOS 76  BILITOT 0.6  PROT 6.1*  ALBUMIN 3.1*   No results for input(s): LIPASE, AMYLASE in the last 168 hours. No results for input(s): AMMONIA in  the last 168 hours. Coagulation Profile: No results for input(s): INR, PROTIME in the last 168 hours. Cardiac Enzymes:  Recent Labs Lab 11/23/15 2131 11/24/15 0319  TROPONINI <0.03 <0.03   BNP (last 3 results) No results for input(s): PROBNP in the last 8760 hours. HbA1C: No results for input(s): HGBA1C in the last 72 hours. CBG: No results for input(s): GLUCAP in the last 168 hours. Lipid Profile:  Recent Labs  11/25/15 0459  CHOL 139  HDL 42  LDLCALC 65  TRIG 158*  CHOLHDL 3.3   Thyroid Function Tests: No results for input(s): TSH, T4TOTAL, FREET4, T3FREE, THYROIDAB in the last 72 hours. Anemia Panel: No results for input(s): VITAMINB12, FOLATE, FERRITIN, TIBC, IRON, RETICCTPCT in the last 72 hours. Sepsis Labs: No results for input(s): PROCALCITON, LATICACIDVEN in the last 168 hours.  No results found for this or any previous  visit (from the past 240 hour(s)).       Radiology Studies: Mr Shirlee Latch FG Contrast  Result Date: 11/25/2015 CLINICAL DATA:  Headache and chest pain, worse after recent administration of nitroglycerin. Resolving PRES. EXAM: MRI HEAD WITHOUT CONTRAST MRA HEAD WITHOUT CONTRAST TECHNIQUE: Multiplanar, multiecho pulse sequences of the brain and surrounding structures were obtained without intravenous contrast. Angiographic images of the head were obtained using MRA technique without contrast. COMPARISON:  MRI brain and MRA intracranial most recent 11/10/2015. CT head 11/21/2015. FINDINGS: MRI HEAD FINDINGS Previous changes of restricted diffusion, as well as T2 and FLAIR hyperintensity in the parieto-occipital lobes shown significant improvement. No restriction is present, whereas white matter changes are minimal. No chronic or acute hemorrhage. Small volume subarachnoid hemorrhage, LEFT frontal sulci, is redemonstrated but improved as well. No acute stroke, mass lesion, hydrocephalus, or new significant finding from 07/15. MRA HEAD FINDINGS Internal carotid arteries are widely patent. The basilar artery is mildly hypoplastic due to fetal LEFT PCA. LEFT vertebral is the dominant/sole contributor. Moderately diseased A1 ACA on the LEFT. Azygos A2 segment proximally. Mildly irregular M1 MCA segments bilaterally. No intracranial aneurysm. Similar appearance to prior MRA. IMPRESSION: Improved, near normalized, intracranial sequelae of PRES. Residual subarachnoid blood over the LEFT frontal convexity, is also improved. No acute intracranial findings. No abnormal postcontrast enhancement. Stable intracranial MRA appearance. Electronically Signed   By: Elsie Stain M.D.   On: 11/25/2015 15:17  Mr Laqueta Jean BM Contrast  Result Date: 11/25/2015 CLINICAL DATA:  Headache and chest pain, worse after recent administration of nitroglycerin. Resolving PRES. EXAM: MRI HEAD WITHOUT CONTRAST MRA HEAD WITHOUT CONTRAST  TECHNIQUE: Multiplanar, multiecho pulse sequences of the brain and surrounding structures were obtained without intravenous contrast. Angiographic images of the head were obtained using MRA technique without contrast. COMPARISON:  MRI brain and MRA intracranial most recent 11/10/2015. CT head 11/21/2015. FINDINGS: MRI HEAD FINDINGS Previous changes of restricted diffusion, as well as T2 and FLAIR hyperintensity in the parieto-occipital lobes shown significant improvement. No restriction is present, whereas white matter changes are minimal. No chronic or acute hemorrhage. Small volume subarachnoid hemorrhage, LEFT frontal sulci, is redemonstrated but improved as well. No acute stroke, mass lesion, hydrocephalus, or new significant finding from 07/15. MRA HEAD FINDINGS Internal carotid arteries are widely patent. The basilar artery is mildly hypoplastic due to fetal LEFT PCA. LEFT vertebral is the dominant/sole contributor. Moderately diseased A1 ACA on the LEFT. Azygos A2 segment proximally. Mildly irregular M1 MCA segments bilaterally. No intracranial aneurysm. Similar appearance to prior MRA. IMPRESSION: Improved, near normalized, intracranial sequelae of PRES. Residual subarachnoid blood over  the LEFT frontal convexity, is also improved. No acute intracranial findings. No abnormal postcontrast enhancement. Stable intracranial MRA appearance. Electronically Signed   By: Elsie Stain M.D.   On: 11/25/2015 15:17       Scheduled Meds: . aspirin  81 mg Oral Daily  . atorvastatin  80 mg Oral QPM  . gentamicin ointment   Topical TID  . isosorbide mononitrate  30 mg Oral Daily  . levETIRAcetam  500 mg Oral BID  . lisinopril  20 mg Oral BID  . metoprolol tartrate  25 mg Oral BID  . mometasone-formoterol  2 puff Inhalation BID  . pantoprazole  40 mg Oral BID AC  . potassium chloride SA  60 mEq Oral Daily  . sodium chloride flush  3 mL Intravenous Q12H   Continuous Infusions:    LOS: 1 day    Time  spent: 40 mins    Darriana Deboy, MD [facility] Pager [phone]  If 7PM-7AM, please contact night-coverage [email or web address] Password [email or web address] 11/26/2015, 11:18 AM

## 2015-11-26 NOTE — Progress Notes (Signed)
Echocardiogram 2D Echocardiogram has been performed.  Joan Newman 11/26/2015, 2:51 PM

## 2015-11-26 NOTE — Progress Notes (Signed)
Patient complained of blood on toilet paper when wiping post bowel movement.  Dr. Janee Morn aware.

## 2015-11-26 NOTE — Progress Notes (Addendum)
Patient had brief run of SVT at 1525.  Patient stated she was up moving around and felt palpitations.  Dr.  Janee Morn notified, stated to notify cardiology.  I have text paged Dr. Elease Hashimoto. He stated to continue with current plan.  Will continue to monitor

## 2015-11-27 LAB — BASIC METABOLIC PANEL
ANION GAP: 8 (ref 5–15)
BUN: 7 mg/dL (ref 6–20)
CHLORIDE: 102 mmol/L (ref 101–111)
CO2: 27 mmol/L (ref 22–32)
Calcium: 9.6 mg/dL (ref 8.9–10.3)
Creatinine, Ser: 0.57 mg/dL (ref 0.44–1.00)
GFR calc Af Amer: >60 mL/min (ref >60)
Glucose, Bld: 152 mg/dL — ABNORMAL HIGH (ref 65–99)
POTASSIUM: 4.3 mmol/L (ref 3.5–5.1)
SODIUM: 137 mmol/L (ref 135–145)

## 2015-11-27 MED ORDER — FLUTICASONE-SALMETEROL 250-50 MCG/DOSE IN AEPB: 2.0000 | INHALATION_SPRAY | Freq: Two times a day (BID) | RESPIRATORY_TRACT | 0 refills | Status: AC

## 2015-11-27 MED ORDER — PANTOPRAZOLE SODIUM 40 MG PO TBEC: 40.0000 mg | DELAYED_RELEASE_TABLET | Freq: Two times a day (BID) | ORAL | 0 refills | Status: AC

## 2015-11-27 MED ORDER — ISOSORBIDE MONONITRATE ER 30 MG PO TB24: 30.0000 mg | ORAL_TABLET | Freq: Every day | ORAL | 0 refills | Status: DC

## 2015-11-27 MED ORDER — K-TAB 20 MEQ PO TBCR: 3.0000 | EXTENDED_RELEASE_TABLET | Freq: Every day | ORAL | 0 refills | Status: DC

## 2015-11-27 MED ORDER — TRAMADOL HCL 50 MG PO TABS: 100.0000 mg | ORAL_TABLET | Freq: Four times a day (QID) | ORAL | 0 refills | Status: DC | PRN

## 2015-11-27 MED ORDER — LISINOPRIL 20 MG PO TABS
20.0000 mg | ORAL_TABLET | Freq: Two times a day (BID) | ORAL | 0 refills | Status: DC
Start: 2015-11-27 — End: 2018-01-19

## 2015-11-27 MED ORDER — TOBRAMYCIN 0.3 % OP OINT: TOPICAL_OINTMENT | Freq: Three times a day (TID) | OPHTHALMIC | 0 refills | Status: AC

## 2015-11-27 MED ORDER — NITROGLYCERIN 0.4 MG SL SUBL: 0.4000 mg | SUBLINGUAL_TABLET | SUBLINGUAL | 0 refills | Status: AC | PRN

## 2015-11-27 MED ORDER — LEVETIRACETAM 500 MG PO TABS: 500.0000 mg | ORAL_TABLET | Freq: Two times a day (BID) | ORAL | 0 refills | Status: DC

## 2015-11-27 MED ORDER — METOPROLOL TARTRATE 25 MG PO TABS: 25.0000 mg | ORAL_TABLET | Freq: Two times a day (BID) | ORAL | 0 refills | Status: DC

## 2015-11-27 MED ORDER — ZOLPIDEM TARTRATE 10 MG PO TABS: 10.0000 mg | ORAL_TABLET | Freq: Every evening | ORAL | 0 refills | Status: DC | PRN

## 2015-11-27 MED ORDER — ATORVASTATIN CALCIUM 80 MG PO TABS: 80.0000 mg | ORAL_TABLET | Freq: Every evening | ORAL | 0 refills | Status: AC

## 2015-11-27 NOTE — Discharge Instructions (Addendum)
Seizure, Adult °A seizure means there is unusual activity in the brain. A seizure can cause changes in attention or behavior. Seizures often cause shaking (convulsions). Seizures often last from 30 seconds to 2 minutes. °HOME CARE  °· If you are given medicines, take them exactly as told by your doctor. °· Keep all doctor visits as told. °· Do not swim or drive until your doctor says it is okay. °· Teach others what to do if you have a seizure. They should: °¨ Lay you on the ground. °¨ Put a cushion under your head. °¨ Loosen any tight clothing around your neck. °¨ Turn you on your side. °¨ Stay with you until you get better. °GET HELP RIGHT AWAY IF:  °· The seizure lasts longer than 2 to 5 minutes. °· The seizure is very bad. °· The person does not wake up after the seizure. °· The person's attention or behavior changes. °Drive the person to the emergency room or call your local emergency services (911 in [place] °MAKE SURE YOU:  °· Understand these instructions. °· Will watch your condition. °· Will get help right away if you are not doing well or get worse. °  °This information is not intended to replace advice given to you by your health care provider. Make sure you discuss any questions you have with your health care provider. °  °Document Released: 10/01/2007 Document Revised: 07/07/2011 Document Reviewed: 11/24/2012 °Elsevier Interactive Patient Education ©2016 [employer]. ° °

## 2015-11-27 NOTE — Clinical Social Work Note (Signed)
CSW met with member to inform her that no other ALF's are willing to accept her with an LOG. The patient continues to refuse return to [facility]. CSW explained that this is not in the patient's best interest given that she will not be able to get her medications (per member) and will not have support or supervision at home. The member insists that she will not return. She requests that someone help with getting her belongings at [facility]. CSW explained that the patient will have to work this out with someone in her family. CSW signing off at this time.    Liz Beach MSW, [place], [place], [phone]

## 2015-11-27 NOTE — Progress Notes (Signed)
PATIENT ID: 55 year old woman with CAD status post NSTEMI (DES-->LAD 2011), hypertension, COPD, seizures, recent SAH and tobacco abuse here with chest pain.   SUBJECTIVE:  Continues to have   headaches.  Echo shows normal LV function . Moderate pulmonary artery hypertension -   PHYSICAL EXAM Vitals:   11/26/15 1426 11/26/15 2005 11/26/15 2115 11/27/15 0500  BP: 118/62  (!) 120/52 (!) 118/52  Pulse: 65  66 (!) 58  Resp: 18  18 16   Temp: 97.6 F (36.4 C)  98.4 F (36.9 C) 98.1 F (36.7 C)  TempSrc: Oral  Oral Oral  SpO2: 99% 97% 98% 96%  Weight:    154 lb 4.8 oz (70 kg)  Height:       General:  Well appearing. No respiratory difficulty,   HEENT: Bilateral conjunctival injection with appointment in place Neck: supple. no JVD. Carotids 2+ bilat; no bruits. No lymphadenopathy or thryomegaly appreciated. Cor: PMI nondisplaced. Regular rate & rhythm. No rubs, gallops or murmurs. Lungs: clear Abdomen: soft, nontender, nondistended. No hepatosplenomegaly. No bruits or masses. Good bowel sounds. Extremities: no cyanosis, clubbing, rash, edema Neuro: alert & oriented x 3, cranial nerves grossly intact. moves all 4 extremities w/o difficulty. Affect pleasant.  LABS: Lab Results  Component Value Date   TROPONINI <0.03 11/24/2015   Results for orders placed or performed during the hospital encounter of 11/23/15 (from the past 24 hour(s))  Basic metabolic panel     Status: Abnormal   Collection Time: 11/27/15  4:09 AM  Result Value Ref Range   Sodium 137 135 - 145 mmol/L   Potassium 4.3 3.5 - 5.1 mmol/L   Chloride 102 101 - 111 mmol/L   CO2 27 22 - 32 mmol/L   Glucose, Bld 152 (H) 65 - 99 mg/dL   BUN 7 6 - 20 mg/dL   Creatinine, Ser 5.99 0.44 - 1.00 mg/dL   Calcium 9.6 8.9 - 35.7 mg/dL   GFR calc non Af Amer >60 >60 mL/min   GFR calc Af Amer >60 >60 mL/min   Anion gap 8 5 - 15    Intake/Output Summary (Last 24 hours) at 11/27/15 0177 Last data filed at 11/27/15 9390  Gross per 24 hour  Intake              543 ml  Output             1850 ml  Net            -1307 ml    Telemetry:  Sinus bradycardia and sinus rhythm  ASSESSMENT AND PLAN:  Principal Problem:   Pain in the chest Active Problems:   Chronic obstructive pulmonary disease (HCC)   Essential hypertension   Tobacco use disorder   CAD (coronary artery disease)   Hyperlipidemia   Anxiety   Depression   Seizures (HCC)   Subarachnoid bleed (HCC)   Headache   Chest pain   # Chest pain: # CAD s/p NSTEMI 2011: Joan Newman seems to be feeling better. No recent CP Had a stress myoview a month ago. Echo shows normal LV systolic function She has moderate PUlmonary hypertension from her COPD  She smoked up until several weeks ago   # Hypertension: BP well-controlled on metoprolol and lisinopril.   # Hyperlipidemia: Continue atorvastatin.  LDL 65.  # Recent SAH: Per IM.  Will sign off.   May follow up with Dr. Duke Salvia if she need further cardiac follow up    Kristeen Miss, MD  11/27/2015 9:22 AM    [facility] [address]  [address] [place], [place]  [address] Pager [phone] Phone: [phone]; Fax: [phone]

## 2015-11-27 NOTE — Discharge Summary (Signed)
Physician Discharge Summary  Joan Newman ZOX:[id number] DOB: 1961-01-11 DOA: 11/23/2015  PCP: Lonie Peak, PA-C  Admit date: 11/23/2015 Discharge date: 11/27/2015  Time spent: 65 minutes  Recommendations for Outpatient Follow-up:  1. Patient be discharged home and is to follow-up with Lonie Peak, PA-C on 12/05/2015. On follow-up patient will need a basic metabolic profile done to follow-up on electrolytes and renal function. Patient's headaches will need to be reassessed at that time.   Discharge Diagnoses:  Principal Problem:   Pain in the chest Active Problems:   Chronic obstructive pulmonary disease (HCC)   Essential hypertension   Tobacco use disorder   CAD (coronary artery disease)   Hyperlipidemia   Anxiety   Depression   Seizures (HCC)   Subarachnoid bleed (HCC)   Headache   Chest pain   Discharge Condition: Stable and improved  Diet recommendation: Heart healthy  Filed Weights   11/25/15 0500 11/26/15 0538 11/27/15 0500  Weight: 71.1 kg (156 lb 11.2 oz) 71.7 kg (158 lb 1.6 oz) 70 kg (154 lb 4.8 oz)    History of present illness:  [name] is a 55 y.o. female with medical history significant of anxiety, depression, coronary artery disease,  NSTEMI in 2011 s/p DES to LAD, hypertension,  COPD, tobacco use disorder who was discharged from [facility] to [facility] care following an admission for seizures and subarachnoid hemorrhage on 11/07/2015.  Per patient, around 1300, 1 1/2 hours after being discharged from [facility] and arriving to the [facility] care facility, she experienced precordial chest pain while sitting in bed, stabbing like, radiating to her left shoulder and back associated with dyspnea, mild diaphoresis, nausea and palpitations. The patient rated her pain at 10/10 at that point. She denied pitting edema of the lower extremities, orthopnea or PND.  EMS was subsequently called. She received 324 mg of aspirin and sublingual  nitroglycerin which decreased the pain to about 7/10. She was started on nitro paste in the emergency department which has decreased the discomfort further, but still complains of some chest pain. However she states that she has a significant headache now (has been having headache previously, but it seems that nitroglycerin has worsened it).  She denied fever, chills, abdominal pain, diarrhea, constipation, dysuria or hematuria. . She stated that last month she was admitted for chest pain at [facility] and underwent a Myoview stress test, which apparently was negative. However, I am unable to see those results on the care everywhere tab as its assigned form shows her discharge summary instead.   ED Course: The patient was given on Nitropaste reporting partial relief. Workup shows EKG with inverted T waves on anterolateral leads, mild leukocytosis of 12 K, and hyperglycemia 128 mg/dL.    Hospital Course:  #1 chest pain Patient presented with persistent chest pain intermittent in nature worse on exertion. Patient with prior history of coronary artery disease status post  NSTEMI 2011. Cardiac enzymes negative 2. EKG with T-wave inversions in V2, V4 through V6. 2-D echo pending. Improving clinically. Patient with recent subarachnoid hemorrhage and a such cannot be placed on IV heparin. Patient with nitroglycerin paste/patch on. Patient has been seen by cardiology who recommended conservative management at this time. If 2-D echo is normal no need for stress testing per cardiology. Concern for possible esophageal spasms. GI cocktail as needed. Continued on aspirin, Lipitor, lisinopril, metoprolol, PPI. Discontinued Nitropatch/paste due to worsening headache and placed on Imdur 30 mg daily. Discontinued morphine. Toradol as needed. Patient had  a 2-D echo done 11/26/2015 showed normal LV size and mild LV hypertrophy with ejection fraction of 60-65%.normal right ventricular size and systolic function. No  significant valvular abnormalities. Mild pulmonary hypertension. Patient chest pain resolved during the hospitalization and per cardiology no further cardiac workup needed at this time. Outpatient follow-up.    #2 hypertension Stable. Continued on Lopressor and lisinopril. Patient was on 2 ACE inhibitors prior to admission and one of them was discontinued. Outpatient follow up.  #3 hyperlipidemia Fasting lipid panel with LDL of 65. Continued on a statin.  #4 seizures Stable. No seizures noted during this hospitalization. Patient was maintained on Keppra.   #5 recent subarachnoid hemorrhage Patient with recent CT head 11/21/2015 showing marked improvement and subcortical white matter hypoattenuation over the posterior parietal and frontal lobes compatible with resolving PRES. expected evolution of anterior left frontal lobe subarachnoid hemorrhage. No new hemorrhage or progression. No other acute intracranial abnormality. Repeat MRI MRA of head improved. Outpatient follow-up with neurosurgery.  #6 headache Likely multifactorial secondary to recent subarachnoid hemorrhage and nitroglycerin paste. Nitroglycerin paste discontinued however patient still with headache. Headache improved with IV Toradol. MRI MRA of the head improved. Patient was maintained on IV Toradol as well as as needed ibuprofen 800 mg every 6 hours and Ultram 100 mg every 6 hours as needed. Patient stated that Ultram helped her headaches better than the ibuprofen and as such patient will be discharged home on ultram as needed. Outpatient follow-up with PCP.   #7 depression/anxiety Stable. Continued on home regimen Klonopin as needed.    Procedures:  Chest x-ray 11/23/2015  2-D echo pending 11/26/2015  MRI/MRA head 11/25/2015  Consultations:  Cardiology: Dr. Duke Salvia 11/24/2015  Discharge Exam: Vitals:   11/26/15 2115 11/27/15 0500  BP: (!) 120/52 (!) 118/52  Pulse: 66 (!) 58  Resp: 18 16  Temp: 98.4 F  (36.9 C) 98.1 F (36.7 C)    General: NAD Cardiovascular: RRR Respiratory: CTAB  Discharge Instructions   Discharge Instructions    Diet - low sodium heart healthy    Complete by:  As directed   Discharge instructions    Complete by:  As directed   Follow up with Lonie Peak, PA-C in 1-2 weeks.   Increase activity slowly    Complete by:  As directed     Current Discharge Medication List    START taking these medications   Details  isosorbide mononitrate (IMDUR) 30 MG 24 hr tablet Take 1 tablet (30 mg total) by mouth daily. Qty: 30 tablet, Refills: 0    tobramycin (TOBREX) 0.3 % ophthalmic ointment Place into both eyes 3 (three) times daily. Qty: 3.5 g, Refills: 0    zolpidem (AMBIEN) 10 MG tablet Take 1 tablet (10 mg total) by mouth at bedtime as needed for sleep. Qty: 10 tablet, Refills: 0      CONTINUE these medications which have CHANGED   Details  atorvastatin (LIPITOR) 80 MG tablet Take 1 tablet (80 mg total) by mouth every evening. Qty: 30 tablet, Refills: 0    Fluticasone-Salmeterol (ADVAIR DISKUS) 250-50 MCG/DOSE AEPB Inhale 2 puffs into the lungs 2 (two) times daily. Qty: 60 each, Refills: 0    K-TAB 20 MEQ TBCR Take 3 tablets by mouth daily. Qty: 90 tablet, Refills: 0    levETIRAcetam (KEPPRA) 500 MG tablet Take 1 tablet (500 mg total) by mouth 2 (two) times daily. Qty: 60 tablet, Refills: 0    lisinopril (PRINIVIL,ZESTRIL) 20 MG tablet Take 1 tablet (20  mg total) by mouth 2 (two) times daily. Qty: 60 tablet, Refills: 0    metoprolol tartrate (LOPRESSOR) 25 MG tablet Take 1 tablet (25 mg total) by mouth 2 (two) times daily. Qty: 60 tablet, Refills: 0    nitroGLYCERIN (NITROSTAT) 0.4 MG SL tablet Place 1 tablet (0.4 mg total) under the tongue every 5 (five) minutes as needed for chest pain. Qty: 15 tablet, Refills: 0    pantoprazole (PROTONIX) 40 MG tablet Take 1 tablet (40 mg total) by mouth 2 (two) times daily before a meal. Qty: 60 tablet,  Refills: 0    traMADol (ULTRAM) 50 MG tablet Take 2 tablets (100 mg total) by mouth every 6 (six) hours as needed for moderate pain. Qty: 20 tablet, Refills: 0      CONTINUE these medications which have NOT CHANGED   Details  albuterol (PROVENTIL HFA;VENTOLIN HFA) 108 (90 Base) MCG/ACT inhaler Inhale 2 puffs into the lungs every 4 (four) hours as needed for wheezing or shortness of breath.    aspirin 81 MG chewable tablet Chew 81 mg by mouth daily.    clonazePAM (KLONOPIN) 1 MG tablet Take 1 tablet by mouth at bedtime as needed. Refills: 5    diphenhydramine-acetaminophen (TYLENOL PM) 25-500 MG TABS tablet Take 2 tablets by mouth at bedtime as needed.      STOP taking these medications     quinapril (ACCUPRIL) 20 MG tablet      zolpidem (AMBIEN CR) 12.5 MG CR tablet        No Known Allergies Follow-up Information    Lonie Peak, PA-C. Go on 12/05/2015.   Specialty:  Physician Assistant Why:  Hospital follow up @ 230pm Contact information: [address] [place] [address] [phone]            The results of significant diagnostics from this hospitalization (including imaging, microbiology, ancillary and laboratory) are listed below for reference.    Significant Diagnostic Studies: Dg Chest 2 View  Result Date: 11/23/2015 CLINICAL DATA:  Left-sided chest pain this morning. EXAM: CHEST  2 VIEW COMPARISON:  11/07/2015 FINDINGS: Heart is mildly enlarged. Lungs are clear. No effusions or edema. No acute bony abnormality. IMPRESSION: Mild cardiomegaly.  No active disease. Electronically Signed   By: Charlett Nose M.D.   On: 11/23/2015 16:42  Ct Head Wo Contrast  Result Date: 11/21/2015 CLINICAL DATA:  Persistent headache. EXAM: CT HEAD WITHOUT CONTRAST TECHNIQUE: Contiguous axial images were obtained from the base of the skull through the vertex without intravenous contrast. COMPARISON:  MRI brain 11/10/2015. CT head without contrast 11/08/2015. FINDINGS:  Subarachnoid hemorrhage in the anterior left frontal lobe demonstrates expected evolution. There is no new hemorrhage or progression. Subcortical white matter hypoattenuation over the parietal and posterior frontal lobes bilaterally also demonstrate significant improvement. No acute cortical infarct or hemorrhage is present. The ventricles are of normal size. The paranasal sinuses and mastoid air cells are clear. The calvarium is intact. No significant extracranial soft tissue lesions are present. IMPRESSION: 1. Marked improvement and subcortical white matter hypoattenuation over the posterior parietal and frontal lobes compatible with resolving PRES. 2. Expected evolution of anterior left frontal lobe subarachnoid hemorrhage. No new hemorrhage or progression. 3. No other acute intracranial abnormality. Electronically Signed   By: Marin Roberts M.D.   On: 11/21/2015 21:25  Ct Head Wo Contrast  Result Date: 11/08/2015 CLINICAL DATA:  Posterior reversible encephalopathy syndrome. Altered mental status. EXAM: CT HEAD WITHOUT CONTRAST TECHNIQUE: Contiguous axial images were obtained from  the base of the skull through the vertex without intravenous contrast. COMPARISON:  11/07/2015 [facility]. FINDINGS: The examination suffers from motion but repeat imaging was performed. Again demonstrated is vasogenic edema within the white matter of both cerebral hemispheres extending from the occipital regions to the posterior frontal regions consistent with the clinical diagnosis of posterior reversible encephalopathy. Subarachnoid hemorrhage in the left frontal region is again demonstrated, not increased. No intraparenchymal hemorrhage. No mass effect or shift. No extra-axial collection. The calvarium is unremarkable. Sinuses, middle ears and mastoids are clear. IMPRESSION: No change since yesterday. Vasogenic edema within the hemispheric white matter with a pattern consistent with the clinical diagnosis of  posterior reversible encephalopathy. Subarachnoid hemorrhage in the left frontal region, unchanged. Electronically Signed   By: Paulina Fusi M.D.   On: 11/08/2015 12:17   Mr Shirlee Latch YQ Contrast  Result Date: 11/25/2015 CLINICAL DATA:  Headache and chest pain, worse after recent administration of nitroglycerin. Resolving PRES. EXAM: MRI HEAD WITHOUT CONTRAST MRA HEAD WITHOUT CONTRAST TECHNIQUE: Multiplanar, multiecho pulse sequences of the brain and surrounding structures were obtained without intravenous contrast. Angiographic images of the head were obtained using MRA technique without contrast. COMPARISON:  MRI brain and MRA intracranial most recent 11/10/2015. CT head 11/21/2015. FINDINGS: MRI HEAD FINDINGS Previous changes of restricted diffusion, as well as T2 and FLAIR hyperintensity in the parieto-occipital lobes shown significant improvement. No restriction is present, whereas white matter changes are minimal. No chronic or acute hemorrhage. Small volume subarachnoid hemorrhage, LEFT frontal sulci, is redemonstrated but improved as well. No acute stroke, mass lesion, hydrocephalus, or new significant finding from 07/15. MRA HEAD FINDINGS Internal carotid arteries are widely patent. The basilar artery is mildly hypoplastic due to fetal LEFT PCA. LEFT vertebral is the dominant/sole contributor. Moderately diseased A1 ACA on the LEFT. Azygos A2 segment proximally. Mildly irregular M1 MCA segments bilaterally. No intracranial aneurysm. Similar appearance to prior MRA. IMPRESSION: Improved, near normalized, intracranial sequelae of PRES. Residual subarachnoid blood over the LEFT frontal convexity, is also improved. No acute intracranial findings. No abnormal postcontrast enhancement. Stable intracranial MRA appearance. Electronically Signed   By: Elsie Stain M.D.   On: 11/25/2015 15:17  Mr Maxine Glenn Head Wo Contrast  Result Date: 11/10/2015 CLINICAL DATA:  Initial evaluation for acute PRES, subarachnoid  hemorrhage. EXAM: MRI HEAD WITHOUT AND WITH CONTRAST MRA HEAD WITHOUT CONTRAST TECHNIQUE: Multiplanar, multiecho pulse sequences of the brain and surrounding structures were obtained without and with intravenous contrast. Angiographic images of the head were obtained using MRA technique without contrast. CONTRAST:  15mL MULTIHANCE GADOBENATE DIMEGLUMINE 529 MG/ML IV SOLN COMPARISON:  Prior CT from 11/08/2015. FINDINGS: MRI HEAD FINDINGS Study degraded by motion artifact. Cerebral volume within normal limits for patient age. Minimal confluent T2/FLAIR hyperintensity within the periventricular white matter likely related to chronic small vessel ischemic disease. Patchy multi focal T2/FLAIR abnormality present within the bilateral frontal and parietal lobes extending into the occipital lobes as well as the temporal lobes. There is a posterior distribution, with greatest involvement within the bilateral parieto-occipital region. Associated localized edema without significant mass effect. Findings again consistent with PRES, an are in a overall similar distribution relative to prior CT. No associated hemorrhage within the eye areas of T2/FLAIR signal abnormality. There is associated patchy multi focal restricted diffusion within the bilateral parieto-occipital regions, predominantly cortical in nature. Few scattered areas of faint patchy enhancement is suspected. Abnormal FLAIR signal intensity with associated susceptibility artifact within several cortical sulci of the left frontal  lobe consistent with acute subarachnoid hemorrhage, stable from prior. No other acute intracranial hemorrhage. No acute vascular infarct. Major intracranial vascular flow voids are maintained. Gray-white matter differentiation otherwise preserved. No mass lesion or midline shift. No hydrocephalus. No extra-axial fluid collection. Major dural sinuses are grossly patent. No other abnormal enhancement. Craniocervical junction normal. Visualized  upper cervical spine unremarkable. Pituitary gland within normal limits. No acute abnormality about the globes and orbits. Paranasal sinuses are clear. No mastoid effusion. Inner ear structures within normal limits. Bone marrow signal intensity normal. No scalp soft tissue abnormality. MRA HEAD FINDINGS ANTERIOR CIRCULATION: Study MK moderately degraded by motion artifact. Distal cervical segments of the internal carotid arteries are patent with antegrade flow. Distal cervical ICAs are somewhat tortuous. Petrous, cavernous, and supraclinoid ICA is patent without obvious flow-limiting stenosis. A1 segments patent. Left A1 segment hypoplastic. Anterior communicating artery normal. Anterior cerebral artery appears to be azygos proximally, subsequently dividing. M1 segments patent without stenosis or occlusion. MCA bifurcations normal. Distal MCA branches symmetric. Distal small vessel atheromatous irregularity suspected. POSTERIOR CIRCULATION: Left vertebral artery patent to the vertebrobasilar junction. Right vertebral artery appears to be hypoplastic, and is not well visualized. Posterior inferior cerebral arteries not well evaluated on this exam, although the left appears to be grossly patent proximally. Basilar artery patent to its distal aspect. Superior cerebral arteries patent proximally. Right PCA arises from the basilar artery and is well opacified to its distal aspect. Left P1 segment hypoplastic. Prominent left posterior communicating artery. Left PCA also supplied to its distal aspect. Probable distal small vessel atheromatous irregularity within the PCAs bilaterally is well. IMPRESSION: MRI HEAD IMPRESSION: 1. Findings compatible with PRES, not significantly changed in appearance and distribution relative to most recent CT. 2. Small volume convexal subarachnoid hemorrhage within the left frontal region, stable. 3. No other acute intracranial abnormality. MRA HEAD IMPRESSION: 1. Motion degraded study. No  large or proximal arterial branch occlusion. No high-grade or correctable stenosis. 2. Poor visualization of the hypoplastic right vertebral artery. Dominant left vertebral artery widely patent. 3. Azygos ACA. 4. Distal small vessel atheromatous irregularity within the MCA and PCA branches bilaterally. Electronically Signed   By: Rise Mu M.D.   On: 11/10/2015 05:20   Mr Laqueta Jean ZO Contrast  Result Date: 11/25/2015 CLINICAL DATA:  Headache and chest pain, worse after recent administration of nitroglycerin. Resolving PRES. EXAM: MRI HEAD WITHOUT CONTRAST MRA HEAD WITHOUT CONTRAST TECHNIQUE: Multiplanar, multiecho pulse sequences of the brain and surrounding structures were obtained without intravenous contrast. Angiographic images of the head were obtained using MRA technique without contrast. COMPARISON:  MRI brain and MRA intracranial most recent 11/10/2015. CT head 11/21/2015. FINDINGS: MRI HEAD FINDINGS Previous changes of restricted diffusion, as well as T2 and FLAIR hyperintensity in the parieto-occipital lobes shown significant improvement. No restriction is present, whereas white matter changes are minimal. No chronic or acute hemorrhage. Small volume subarachnoid hemorrhage, LEFT frontal sulci, is redemonstrated but improved as well. No acute stroke, mass lesion, hydrocephalus, or new significant finding from 07/15. MRA HEAD FINDINGS Internal carotid arteries are widely patent. The basilar artery is mildly hypoplastic due to fetal LEFT PCA. LEFT vertebral is the dominant/sole contributor. Moderately diseased A1 ACA on the LEFT. Azygos A2 segment proximally. Mildly irregular M1 MCA segments bilaterally. No intracranial aneurysm. Similar appearance to prior MRA. IMPRESSION: Improved, near normalized, intracranial sequelae of PRES. Residual subarachnoid blood over the LEFT frontal convexity, is also improved. No acute intracranial findings. No abnormal postcontrast enhancement. Stable  intracranial MRA appearance. Electronically Signed   By: Elsie Stain M.D.   On: 11/25/2015 15:17  Mr Laqueta Jean YQ Contrast  Result Date: 11/10/2015 CLINICAL DATA:  Initial evaluation for acute PRES, subarachnoid hemorrhage. EXAM: MRI HEAD WITHOUT AND WITH CONTRAST MRA HEAD WITHOUT CONTRAST TECHNIQUE: Multiplanar, multiecho pulse sequences of the brain and surrounding structures were obtained without and with intravenous contrast. Angiographic images of the head were obtained using MRA technique without contrast. CONTRAST:  37mL MULTIHANCE GADOBENATE DIMEGLUMINE 529 MG/ML IV SOLN COMPARISON:  Prior CT from 11/08/2015. FINDINGS: MRI HEAD FINDINGS Study degraded by motion artifact. Cerebral volume within normal limits for patient age. Minimal confluent T2/FLAIR hyperintensity within the periventricular white matter likely related to chronic small vessel ischemic disease. Patchy multi focal T2/FLAIR abnormality present within the bilateral frontal and parietal lobes extending into the occipital lobes as well as the temporal lobes. There is a posterior distribution, with greatest involvement within the bilateral parieto-occipital region. Associated localized edema without significant mass effect. Findings again consistent with PRES, an are in a overall similar distribution relative to prior CT. No associated hemorrhage within the eye areas of T2/FLAIR signal abnormality. There is associated patchy multi focal restricted diffusion within the bilateral parieto-occipital regions, predominantly cortical in nature. Few scattered areas of faint patchy enhancement is suspected. Abnormal FLAIR signal intensity with associated susceptibility artifact within several cortical sulci of the left frontal lobe consistent with acute subarachnoid hemorrhage, stable from prior. No other acute intracranial hemorrhage. No acute vascular infarct. Major intracranial vascular flow voids are maintained. Gray-white matter differentiation  otherwise preserved. No mass lesion or midline shift. No hydrocephalus. No extra-axial fluid collection. Major dural sinuses are grossly patent. No other abnormal enhancement. Craniocervical junction normal. Visualized upper cervical spine unremarkable. Pituitary gland within normal limits. No acute abnormality about the globes and orbits. Paranasal sinuses are clear. No mastoid effusion. Inner ear structures within normal limits. Bone marrow signal intensity normal. No scalp soft tissue abnormality. MRA HEAD FINDINGS ANTERIOR CIRCULATION: Study MK moderately degraded by motion artifact. Distal cervical segments of the internal carotid arteries are patent with antegrade flow. Distal cervical ICAs are somewhat tortuous. Petrous, cavernous, and supraclinoid ICA is patent without obvious flow-limiting stenosis. A1 segments patent. Left A1 segment hypoplastic. Anterior communicating artery normal. Anterior cerebral artery appears to be azygos proximally, subsequently dividing. M1 segments patent without stenosis or occlusion. MCA bifurcations normal. Distal MCA branches symmetric. Distal small vessel atheromatous irregularity suspected. POSTERIOR CIRCULATION: Left vertebral artery patent to the vertebrobasilar junction. Right vertebral artery appears to be hypoplastic, and is not well visualized. Posterior inferior cerebral arteries not well evaluated on this exam, although the left appears to be grossly patent proximally. Basilar artery patent to its distal aspect. Superior cerebral arteries patent proximally. Right PCA arises from the basilar artery and is well opacified to its distal aspect. Left P1 segment hypoplastic. Prominent left posterior communicating artery. Left PCA also supplied to its distal aspect. Probable distal small vessel atheromatous irregularity within the PCAs bilaterally is well. IMPRESSION: MRI HEAD IMPRESSION: 1. Findings compatible with PRES, not significantly changed in appearance and  distribution relative to most recent CT. 2. Small volume convexal subarachnoid hemorrhage within the left frontal region, stable. 3. No other acute intracranial abnormality. MRA HEAD IMPRESSION: 1. Motion degraded study. No large or proximal arterial branch occlusion. No high-grade or correctable stenosis. 2. Poor visualization of the hypoplastic right vertebral artery. Dominant left vertebral artery widely patent. 3. Azygos ACA. 4. Distal small vessel atheromatous  irregularity within the MCA and PCA branches bilaterally. Electronically Signed   By: Rise Mu M.D.   On: 11/10/2015 05:20    Microbiology: No results found for this or any previous visit (from the past 240 hour(s)).   Labs: Basic Metabolic Panel:  Recent Labs Lab 11/23/15 1535 11/24/15 0319 11/25/15 0459 11/26/15 0524 11/27/15 0409  NA 138 138 138 136 137  K 4.3 4.0 3.9 4.2 4.3  CL 101 103 102 101 102  CO2 GLUCOSE 128* 129* 179* 187* 152*  BUN CREATININE 0.57 0.54 0.65 0.77 0.57  CALCIUM 9.3 9.0 9.5 8.9 9.6  MG  --   --  1.7 2.0  --    Liver Function Tests:  Recent Labs Lab 11/24/15 0319  AST 17  ALT 20  ALKPHOS 76  BILITOT 0.6  PROT 6.1*  ALBUMIN 3.1*   No results for input(s): LIPASE, AMYLASE in the last 168 hours. No results for input(s): AMMONIA in the last 168 hours. CBC:  Recent Labs Lab 11/23/15 1535 11/24/15 0319 11/25/15 0459 11/26/15 0524  WBC 12.0* 9.6 11.0* 8.6  NEUTROABS  --  5.6  --   --   HGB 14.4 13.8 14.1 14.2  HCT 44.0 42.9 43.5 43.9  MCV 100.0 100.7* 101.4* 101.4*  PLT 282 265 280 243   Cardiac Enzymes:  Recent Labs Lab 11/23/15 2131 11/24/15 0319  TROPONINI <0.03 <0.03   BNP: BNP (last 3 results) No results for input(s): BNP in the last 8760 hours.  ProBNP (last 3 results) No results for input(s): PROBNP in the last 8760 hours.  CBG: No results for input(s): GLUCAP in the last 168 hours.     SignedRamiro Harvest  MD.  [facility] 11/27/2015, 11:43 AM

## 2015-11-27 NOTE — Progress Notes (Signed)
Occupational Therapy Treatment/Discharge Patient Details Name: Joan Newman MRN: [id number] DOB: February 12, 1961 Today's Date: 11/27/2015    History of present illness Pt admitted on 11/07/15 with seizure activity and SAH. PMH which includes HTN, HLD, COPD, and Anxiety. Records from OSH also report PMH including MI, CAD s/p DES to LAD   OT comments  Focus of session on pt's cognitive concerns - administered Montreal Cognitive Assessment (MoCA) and pt scored 19/30 indicating mild cognitive impairment with deficits concentrated in short-term/working memory. Provided pt with compensatory strategies for memory deficits related to medication management (making medicine and administer time list) and home safety strategies. Recommend DME listed below to increase pt's safety at home while completing basic ADLs as pt will be returning home alone without supervision. Pt with no further acute OT needs. OT signing off.   Follow Up Recommendations  No OT follow up;Supervision - Intermittent    Equipment Recommendations  Tub/shower bench;3 in 1 bedside comode (RW, medical alert system)    Recommendations for Other Services      Precautions / Restrictions Precautions Precautions: None Restrictions Weight Bearing Restrictions: No       Mobility Bed Mobility Overal bed mobility: Modified Independent                Transfers Overall transfer level: Modified independent Equipment used: None                  Balance Overall balance assessment: No apparent balance deficits (not formally assessed)                                 ADL Overall ADL's : Modified independent                                              Vision                 Additional Comments: Bilateral conjunctivitis - receiving medication   Perception     Praxis      Cognition   Behavior During Therapy: WFL for tasks assessed/performed Overall Cognitive Status:  Impaired/Different from baseline Area of Impairment: Memory;Problem solving     Memory: Decreased short-term memory        Problem Solving: Slow processing General Comments: Administered Montreal Cognitive Assessment (MoCA) and pt scored 19/30 indicating mild cognitive impairment with deficits concentrated in short-term and working memory.    Extremity/Trunk Assessment               Exercises     Shoulder Instructions       General Comments      Pertinent Vitals/ Pain       Pain Assessment: 0-10 Pain Score: 4  Pain Location: headache Pain Descriptors / Indicators: Headache Pain Intervention(s): Monitored during session  Home Living                                          Prior Functioning/Environment              Frequency       Progress Toward Goals  OT Goals(current goals can now be found in the care plan section)  Progress towards OT goals: Goals met/education completed, patient discharged  from OT  Acute Rehab OT Goals Patient Stated Goal: to not go back to [facility], but go somewhere else and be safe OT Goal Formulation: With patient Time For Goal Achievement: 12/10/15 Potential to Achieve Goals: Good ADL Goals Pt Will Perform Grooming: with modified independence;standing Pt Will Perform Upper Body Bathing: with modified independence;sitting Pt Will Perform Lower Body Bathing: with modified independence;sit to/from stand Pt Will Transfer to Toilet: with modified independence;ambulating;bedside commode Pt Will Perform Toileting - Clothing Manipulation and hygiene: with modified independence;sit to/from stand Pt Will Perform Tub/Shower Transfer: Tub transfer;ambulating;tub bench Additional ADL Goal #1: Pt will verbalize 2 home safety strategies to increase safety at home.  Plan All goals met and education completed, patient discharged from OT services    Co-evaluation                 End of Session     Activity  Tolerance Patient tolerated treatment well   Patient Left in bed;with call bell/phone within reach   Nurse Communication Mobility status        Time: 2458-0998 OT Time Calculation (min): 26 min  Charges: OT General Charges $OT Visit: 1 Procedure OT Treatments $Therapeutic Activity: 23-37 mins  Redmond Baseman, OTR/L Pager: [phone] 11/27/2015, 2:18 PM

## 2015-11-27 NOTE — Progress Notes (Signed)
Patient given discharge instruction along with follow up appointments and medication information/prescriptions. No further questions at this time. Patient discharged with equipment recommended for her to have by private vehicle.

## 2015-12-08 ENCOUNTER — Emergency Department (HOSPITAL_COMMUNITY)
Admission: EM | Admit: 2015-12-08 | Discharge: 2015-12-09 | Disposition: A | Discharge status: Home / Self Care | Payer: Self-pay | Attending: Emergency Medicine | Admitting: Emergency Medicine

## 2015-12-08 ENCOUNTER — Encounter (HOSPITAL_COMMUNITY): Payer: Self-pay | Admitting: Emergency Medicine

## 2015-12-08 ENCOUNTER — Emergency Department (HOSPITAL_COMMUNITY): Payer: Self-pay

## 2015-12-08 DIAGNOSIS — I10 Essential (primary) hypertension: Secondary | ICD-10-CM | POA: Insufficient documentation

## 2015-12-08 DIAGNOSIS — I2510 Atherosclerotic heart disease of native coronary artery without angina pectoris: Secondary | ICD-10-CM | POA: Insufficient documentation

## 2015-12-08 DIAGNOSIS — R0789 Other chest pain: Secondary | ICD-10-CM | POA: Insufficient documentation

## 2015-12-08 DIAGNOSIS — K388 Other specified diseases of appendix: Secondary | ICD-10-CM | POA: Insufficient documentation

## 2015-12-08 DIAGNOSIS — F17210 Nicotine dependence, cigarettes, uncomplicated: Secondary | ICD-10-CM | POA: Insufficient documentation

## 2015-12-08 DIAGNOSIS — Z7982 Long term (current) use of aspirin: Secondary | ICD-10-CM | POA: Insufficient documentation

## 2015-12-08 DIAGNOSIS — Z8673 Personal history of transient ischemic attack (TIA), and cerebral infarction without residual deficits: Secondary | ICD-10-CM | POA: Insufficient documentation

## 2015-12-08 DIAGNOSIS — J449 Chronic obstructive pulmonary disease, unspecified: Secondary | ICD-10-CM | POA: Insufficient documentation

## 2015-12-08 HISTORY — DX: Unspecified asthma, uncomplicated: J45.909

## 2015-12-08 LAB — CBC
HEMATOCRIT: 43.2 % (ref 36.0–46.0)
HEMOGLOBIN: 14.3 g/dL (ref 12.0–15.0)
MCH: 32.8 pg (ref 26.0–34.0)
MCHC: 33.1 g/dL (ref 30.0–36.0)
MCV: 99.1 fL (ref 78.0–100.0)
Platelets: 220 10*3/uL (ref 150–400)
RBC: 4.36 MIL/uL (ref 3.87–5.11)
RDW: 12.8 % (ref 11.5–15.5)
WBC: 10.4 10*3/uL (ref 4.0–10.5)

## 2015-12-08 LAB — BASIC METABOLIC PANEL
ANION GAP: 10 (ref 5–15)
BUN: 7 mg/dL (ref 6–20)
CALCIUM: 8.8 mg/dL — AB (ref 8.9–10.3)
CO2: 28 mmol/L (ref 22–32)
Chloride: 98 mmol/L — ABNORMAL LOW (ref 101–111)
Creatinine, Ser: 0.72 mg/dL (ref 0.44–1.00)
GFR calc Af Amer: >60 mL/min (ref >60)
GFR calc non Af Amer: >60 mL/min (ref >60)
GLUCOSE: 173 mg/dL — AB (ref 65–99)
POTASSIUM: 4 mmol/L (ref 3.5–5.1)
Sodium: 136 mmol/L (ref 135–145)

## 2015-12-08 LAB — I-STAT TROPONIN, ED: Troponin i, poc: 0 ng/mL (ref 0.00–0.08)

## 2015-12-08 MED ORDER — ONDANSETRON HCL 4 MG/2ML IJ SOLN
4.0000 mg | Freq: Once | INTRAMUSCULAR | Status: AC
  Administered 2015-12-08: 4 mg via INTRAVENOUS
  Filled 2015-12-08: qty 2

## 2015-12-08 MED ORDER — KETOROLAC TROMETHAMINE 30 MG/ML IJ SOLN
30.0000 mg | Freq: Once | INTRAMUSCULAR | Status: AC
  Administered 2015-12-09: 30 mg via INTRAVENOUS
  Filled 2015-12-08: qty 1

## 2015-12-08 MED ORDER — MORPHINE SULFATE (PF) 4 MG/ML IV SOLN
4.0000 mg | Freq: Once | INTRAVENOUS | Status: AC
  Administered 2015-12-09: 4 mg via INTRAVENOUS
  Filled 2015-12-08: qty 1

## 2015-12-08 NOTE — ED Triage Notes (Signed)
Patient arrives via EMS from home with complaint of chest pain and SOB which began tonight around 2000. History of similar 2 weeks ago with admission here. Per patient she had a seizure which preceded the first episode of chest pain. Patient self administered 324 ASA and 2 SL NTG PTA. EMS administered 1 SL NTG, 10mg  Morphine, 4mg  Zofran, and 200mL of NS PTA.

## 2015-12-08 NOTE — ED Provider Notes (Signed)
[facility]-EMERGENCY DEPT Provider Note   CSN: [id number] Arrival date & time: 12/08/15  2212  First Provider Contact:  First MD Initiated Contact with Patient 12/08/15 2304     By signing my name below, I, Suzan Slick. Elon Spanner, attest that this documentation has been prepared under the direction and in the presence of Charlynne Pander, MD.  Electronically Signed: Suzan Slick. Elon Spanner, ED Scribe. 12/08/15. 6:03 AM.   History   Chief Complaint Chief Complaint  Patient presents with  . Chest Pain   The history is provided by the patient. No language interpreter was used.    HPI Comments: Joan Newman, brought in by EMS is a 55 y.o. female with a PMHx of CAD, NSTEMI in 2011 with cardiac stent LAD placement, HTN, and stroke who presents to the Emergency Department complaining of constant, ongoing chest pain that radiates to the back onset 8:00 PM this evening. Pain is described as pressure. She also reports intermittent shortness of breath and ongoing nausea. No aggravating or alleviating factors reported. Pt states current symptoms do not feel similar to previous NSTEMI as she only had shoulder pain and back pain at that time. 2 doses of Nitro and 4 of ASA attempted prior to EMS arrival. Morphine given en route to department without any relief. Pt was recently admitted to the hospital on 11/23/15 and discharged on 11/27/15. During time of stay, pt had extensive workup. She was also evaluated by cardiology at that time and had a normal echo and cardiology didn't recommend further workup. She is currently on Potassium, Imdor, Lopressor, Lisinopril, Protonix, Neurontin, and Cholesterol daily. However, she is not currently on any anti-coagulants.  PCP: Lonie Peak, PA-C    Past Medical History:  Diagnosis Date  . Anxiety   . Asthma   . Coronary artery disease   . Depression   . Hypertension   . Seizures (HCC)   . Stroke [facility])     Patient Active Problem List   Diagnosis Date Noted  . Chest pain  11/25/2015  . Headache   . Pain in the chest 11/23/2015  . Tobacco use disorder 11/23/2015  . CAD (coronary artery disease) 11/23/2015  . Hyperlipidemia 11/23/2015  . Anxiety 11/23/2015  . Depression 11/23/2015  . Seizures (HCC) 11/23/2015  . Subarachnoid bleed (HCC) 11/23/2015  . PRES (posterior reversible encephalopathy syndrome) 11/08/2015  . Chronic obstructive pulmonary disease (HCC)   . Essential hypertension   . Tick bite     Past Surgical History:  Procedure Laterality Date  . CESAREAN SECTION      OB History    No data available       Home Medications    Prior to Admission medications   Medication Sig Start Date End Date Taking? Authorizing Provider  albuterol (PROVENTIL HFA;VENTOLIN HFA) 108 (90 Base) MCG/ACT inhaler Inhale 2 puffs into the lungs every 4 (four) hours as needed for wheezing or shortness of breath.   Yes Historical Provider, MD  aspirin 81 MG chewable tablet Chew 81 mg by mouth daily. 03/20/10  Yes Historical Provider, MD  atorvastatin (LIPITOR) 80 MG tablet Take 1 tablet (80 mg total) by mouth every evening. 11/27/15  Yes Rodolph Bong, MD  clonazePAM (KLONOPIN) 1 MG tablet Take 1 tablet by mouth at bedtime as needed. 10/08/15  Yes Historical Provider, MD  Fluticasone-Salmeterol (ADVAIR DISKUS) 250-50 MCG/DOSE AEPB Inhale 2 puffs into the lungs 2 (two) times daily. 11/27/15  Yes Rodolph Bong, MD  isosorbide mononitrate (IMDUR) 30 MG  24 hr tablet Take 1 tablet (30 mg total) by mouth daily. 11/27/15  Yes Rodolph Bong, MD  K-TAB 20 MEQ TBCR Take 3 tablets by mouth daily. 11/27/15  Yes Rodolph Bong, MD  levETIRAcetam (KEPPRA) 500 MG tablet Take 1 tablet (500 mg total) by mouth 2 (two) times daily. 11/27/15  Yes Rodolph Bong, MD  lisinopril (PRINIVIL,ZESTRIL) 20 MG tablet Take 1 tablet (20 mg total) by mouth 2 (two) times daily. 11/27/15  Yes Rodolph Bong, MD  metoprolol tartrate (LOPRESSOR) 25 MG tablet Take 1 tablet (25 mg total) by mouth  2 (two) times daily. 11/27/15  Yes Rodolph Bong, MD  nitroGLYCERIN (NITROSTAT) 0.4 MG SL tablet Place 1 tablet (0.4 mg total) under the tongue every 5 (five) minutes as needed for chest pain. 11/27/15  Yes Rodolph Bong, MD  pantoprazole (PROTONIX) 40 MG tablet Take 1 tablet (40 mg total) by mouth 2 (two) times daily before a meal. 11/27/15  Yes Rodolph Bong, MD  tobramycin (TOBREX) 0.3 % ophthalmic ointment Place into both eyes 3 (three) times daily. 11/27/15  Yes Rodolph Bong, MD  traMADol (ULTRAM) 50 MG tablet Take 2 tablets (100 mg total) by mouth every 6 (six) hours as needed for moderate pain. 11/27/15  Yes Rodolph Bong, MD  zolpidem (AMBIEN) 10 MG tablet Take 1 tablet (10 mg total) by mouth at bedtime as needed for sleep. 11/27/15  Yes Rodolph Bong, MD    Family History Family History  Problem Relation Age of Onset  . Adopted: Yes    Social History Social History  Substance Use Topics  . Smoking status: Current Every Day Smoker    Packs/day: 2.00    Types: Cigarettes  . Smokeless tobacco: Current User  . Alcohol use No     Allergies   Review of patient's allergies indicates no known allergies.   Review of Systems Review of Systems  Constitutional: Negative for chills and fever.  Respiratory: Positive for shortness of breath.   Cardiovascular: Positive for chest pain.  Gastrointestinal: Positive for nausea. Negative for vomiting.  Musculoskeletal: Positive for back pain.  Psychiatric/Behavioral: Negative for confusion.  All other systems reviewed and are negative.    Physical Exam Updated Vital Signs BP (!) 107/53   Pulse 71   Resp 12   Ht 5\' 1"  (1.549 m)   Wt 155 lb (70.3 kg)   SpO2 (!) 89%   BMI 29.29 kg/m   Physical Exam  Constitutional: She is oriented to person, place, and time. She appears well-developed and well-nourished. No distress.  HENT:  Head: Normocephalic and atraumatic.  Eyes: EOM are normal.  Neck: Normal range of  motion.  Cardiovascular: Normal rate, regular rhythm and normal heart sounds.   Pulmonary/Chest: Effort normal and breath sounds normal. She exhibits tenderness.  Mild reproducible chest wall tenderness noted.  Abdominal: Soft. She exhibits no distension. There is no tenderness.  Musculoskeletal: Normal range of motion. She exhibits no edema or tenderness.  No pitting edema. No calf tenderness noted.  Neurological: She is alert and oriented to person, place, and time.  Skin: Skin is warm and dry.  Psychiatric: She has a normal mood and affect. Judgment normal.  Nursing note and vitals reviewed.    ED Treatments / Results   DIAGNOSTIC STUDIES: Oxygen Saturation is 98% on RA, normal by my interpretation.    COORDINATION OF CARE: 11:41 PM- Will give Morphine, Toradol, and Zofran. Will order imaging, EKG, and  blood work. Discussed treatment plan with pt at bedside and pt agreed to plan.      Labs (all labs ordered are listed, but only abnormal results are displayed) Labs Reviewed  BASIC METABOLIC PANEL - Abnormal; Notable for the following:       Result Value   Chloride 98 (*)    Glucose, Bld 173 (*)    Calcium 8.8 (*)    All other components within normal limits  URINALYSIS, ROUTINE W REFLEX MICROSCOPIC (NOT AT [facility]) - Abnormal; Notable for the following:    Specific Gravity, Urine >1.046 (*)    All other components within normal limits  CBC  I-STAT TROPOININ, ED  I-STAT TROPOININ, ED  I-STAT CG4 LACTIC ACID, ED    EKG  EKG Interpretation  Date/Time:  Sunday December 09 2015 01:10:25 EDT Ventricular Rate:  76 PR Interval:    QRS Duration: 104 QT Interval:  413 QTC Calculation: 465 R Axis:   36 Text Interpretation:  Sinus rhythm Consider left atrial enlargement Low voltage, precordial leads Abnrm T, consider ischemia, anterolateral lds nonspecific changes since previous  Confirmed by Namine Beahm  MD, Kelvis Berger ([id number]) on 12/09/2015 1:22:33 AM       Radiology Dg Chest 2  View  Result Date: 12/08/2015 CLINICAL DATA:  Acute onset of generalized chest pain and shortness of breath. Initial encounter. EXAM: CHEST  2 VIEW COMPARISON:  Chest radiograph performed 11/23/2015 FINDINGS: The lungs are well-aerated. Mild bibasilar atelectasis is noted. There is no evidence of pleural effusion or pneumothorax. The heart is mildly enlarged. No acute osseous abnormalities are seen. IMPRESSION: Mild cardiomegaly.  Mild bibasilar atelectasis noted. Electronically Signed   By: Roanna RaiderJeffery  Chang M.D.   On: 12/08/2015 23:20   Ct Angio Chest/abd/pel For Dissection W And/or Wo Contrast  Result Date: 12/09/2015 CLINICAL DATA:  Subacute onset of generalized chest and back pain. Assess for aortic dissection. Initial encounter. EXAM: CT ANGIOGRAPHY CHEST, ABDOMEN AND PELVIS TECHNIQUE: Multidetector CT imaging through the chest, abdomen and pelvis was performed using the standard protocol during bolus administration of intravenous contrast. Multiplanar reconstructed images and MIPs were obtained and reviewed to evaluate the vascular anatomy. CONTRAST:  100 mL of Isovue 370 IV contrast COMPARISON:  Chest radiograph performed 12/08/2015 FINDINGS: CTA CHEST FINDINGS There is no evidence of aortic dissection. There is no evidence of aneurysmal dilatation. No significant calcific atherosclerotic disease is seen along the thoracic aorta. The great vessels are grossly unremarkable in appearance. There is no evidence of pulmonary embolus. Mild bibasilar atelectasis or scarring is noted. The lungs are otherwise clear. There is no evidence of significant focal consolidation, pleural effusion or pneumothorax. No masses are identified; no abnormal focal contrast enhancement is seen. Scattered coronary artery calcifications are seen. No mediastinal lymphadenopathy is seen. No pericardial effusion is identified. The great vessels are grossly unremarkable in appearance. No axillary lymphadenopathy is seen. The visualized  portions of the thyroid gland are unremarkable in appearance. No acute osseous abnormalities are seen. There is chronic deformity involving the superior endplate of T11. Review of the MIP images confirms the above findings. CTA ABDOMEN AND PELVIS FINDINGS There is no evidence of aortic dissection. There is no evidence of aneurysmal dilatation. Scattered calcification is seen along the abdominal aorta. The celiac trunk, superior mesenteric artery, bilateral renal arteries and inferior mesenteric artery are grossly unremarkable. The liver and spleen are unremarkable in appearance. A large stone is noted within the gallbladder. The gallbladder is otherwise unremarkable. The pancreas and adrenal glands are  unremarkable. The kidneys are unremarkable in appearance. There is no evidence of hydronephrosis. No renal or ureteral stones are seen. No perinephric stranding is appreciated. No free fluid is identified. The small bowel is unremarkable in appearance. The stomach is within normal limits. No acute vascular abnormalities are seen. A retroaortic left renal vein is noted. The appendix is dilated to 1.1 cm in maximal diameter, without significant soft tissue inflammation. Would correlate for any evidence of appendicitis. The colon is partially filled with stool and is grossly unremarkable in appearance. The bladder is moderately distended and grossly unremarkable. The uterus is unremarkable in appearance. The ovaries are grossly symmetric. No suspicious adnexal masses are seen. No inguinal lymphadenopathy is seen. No acute osseous abnormalities are identified. Review of the MIP images confirms the above findings. IMPRESSION: 1. No evidence of aortic dissection. No evidence of aneurysmal dilatation. Scattered calcification along the abdominal aorta. 2. Appendix dilated to 1.1 cm in maximal diameter, without significant soft tissue inflammation. Would correlate for any clinical evidence of appendicitis. 3. No evidence of  pulmonary embolus. 4. Mild bibasilar atelectasis or scarring noted. 5. Scattered coronary artery calcifications seen. 6. Cholelithiasis.  Gallbladder otherwise unremarkable. Electronically Signed   By: Roanna Raider M.D.   On: 12/09/2015 03:22    Procedures Procedures (including critical care time)  Medications Ordered in ED Medications  morphine 4 MG/ML injection 4 mg (not administered)  ondansetron (ZOFRAN) injection 4 mg (4 mg Intravenous Given 12/08/15 2338)  morphine 4 MG/ML injection 4 mg (4 mg Intravenous Given 12/09/15 0023)  ketorolac (TORADOL) 30 MG/ML injection 30 mg (30 mg Intravenous Given 12/09/15 0023)  sodium chloride 0.9 % bolus 1,000 mL (0 mLs Intravenous Stopped 12/09/15 0400)  iopamidol (ISOVUE-370) 76 % injection (100 mLs  Contrast Given 12/09/15 0217)  sodium chloride 0.9 % bolus 1,000 mL (0 mLs Intravenous Stopped 12/09/15 0543)     Initial Impression / Assessment and Plan / ED Course  I have reviewed the triage vital signs and the nursing notes.  Pertinent labs & imaging results that were available during my care of the patient were reviewed by me and considered in my medical decision making (see chart for details).  Clinical Course   Joan Newman is a 55 y.o. female here with chest pain. Recurrent chest pain, recently admitted and had cardiology consult and had workup that didn't reveal anything. Pain is reproducible. I think likely MSK vs ACS, low suspicion for dissection.   1:30 am BP dropped to 70s. Will order dissection study. Will order 2 L NS bolus.   6:06 AM BP now back up to 130s. Pain controlled. No dissection, incidental dilated appendix but no RLQ tenderness. Has some gallstones and I wonder if that's causing her pain. She is feeling better now. Has no RUQ tenderness. Will give vicodin prn pain. Will have her follow up outpatient.   Final Clinical Impressions(s) / ED Diagnoses   Final diagnoses:  None    New Prescriptions New Prescriptions    No medications on file   I personally performed the services described in this documentation, which was scribed in my presence. The recorded information has been reviewed and is accurate.    Charlynne Pander, MD 12/09/15 [phone]

## 2015-12-09 ENCOUNTER — Emergency Department (HOSPITAL_COMMUNITY): Payer: Self-pay

## 2015-12-09 ENCOUNTER — Encounter (HOSPITAL_COMMUNITY): Payer: Self-pay | Admitting: Radiology

## 2015-12-09 LAB — I-STAT TROPONIN, ED: TROPONIN I, POC: 0.01 ng/mL (ref 0.00–0.08)

## 2015-12-09 LAB — URINALYSIS, ROUTINE W REFLEX MICROSCOPIC
Bilirubin Urine: NEGATIVE
Glucose, UA: NEGATIVE mg/dL
HGB URINE DIPSTICK: NEGATIVE
Ketones, ur: NEGATIVE mg/dL
LEUKOCYTES UA: NEGATIVE
Nitrite: NEGATIVE
PROTEIN: NEGATIVE mg/dL
Specific Gravity, Urine: >1.046 — ABNORMAL HIGH (ref 1.005–1.030)
pH: 5 (ref 5.0–8.0)

## 2015-12-09 LAB — I-STAT CG4 LACTIC ACID, ED: LACTIC ACID, VENOUS: 1.24 mmol/L (ref 0.5–1.9)

## 2015-12-09 MED ORDER — HYDROCODONE-ACETAMINOPHEN 5-325 MG PO TABS: 2.0000 | ORAL_TABLET | Freq: Once | ORAL | Status: DC

## 2015-12-09 MED ORDER — HYDROCODONE-ACETAMINOPHEN 5-325 MG PO TABS
1.0000 | ORAL_TABLET | Freq: Four times a day (QID) | ORAL | 0 refills | Status: DC | PRN
Start: 2015-12-09 — End: 2018-01-19

## 2015-12-09 MED ORDER — IOPAMIDOL (ISOVUE-370) INJECTION 76%
INTRAVENOUS | Status: AC
  Administered 2015-12-09: 100 mL
  Filled 2015-12-09: qty 100

## 2015-12-09 MED ORDER — SODIUM CHLORIDE 0.9 % IV BOLUS (SEPSIS)
1000.0000 mL | Freq: Once | INTRAVENOUS | Status: AC
  Administered 2015-12-09: 1000 mL via INTRAVENOUS

## 2015-12-09 MED ORDER — CYCLOBENZAPRINE HCL 5 MG PO TABS: 5.0000 mg | ORAL_TABLET | Freq: Three times a day (TID) | ORAL | 0 refills | Status: AC | PRN

## 2015-12-09 MED ORDER — MORPHINE SULFATE (PF) 4 MG/ML IV SOLN
4.0000 mg | Freq: Once | INTRAVENOUS | Status: AC
  Administered 2015-12-09: 4 mg via INTRAVENOUS
  Filled 2015-12-09: qty 1

## 2015-12-09 NOTE — ED Notes (Signed)
Patient returned from CT

## 2015-12-09 NOTE — ED Notes (Signed)
Pt reports unable to get a ride home in [place]. Social Worker consult placed and pt waiting in the lobby.

## 2015-12-09 NOTE — Discharge Instructions (Signed)
Take vicodin for severe pain. Do NOT drive with it.   Take flexeril for muscle spasms.   Stay hydrated.   See your doctor for follow up.   Return to ER if you have worse chest pain, shortness of breath, headaches, dizziness, vomiting.

## 2015-12-09 NOTE — ED Notes (Signed)
Patient transported to CT 

## 2018-01-11 IMAGING — CT CT HEAD W/O CM
3 of 7 series · 15 of 47 positions shown, 18 images · non-contrast
Comparison: 11/07/2015 [place].

CLINICAL DATA: Posterior reversible encephalopathy syndrome.
Altered mental status.

EXAM:
CT HEAD WITHOUT CONTRAST
TECHNIQUE: Contiguous axial images were obtained from the base of the skull
through the vertex without intravenous contrast.

[Series 3: head 5.0 h30s · axial · 0.44mm/px · z∈[-53,+67]mm · 9 of 30 slices shown, 12 images]
[im 3/30  brain]
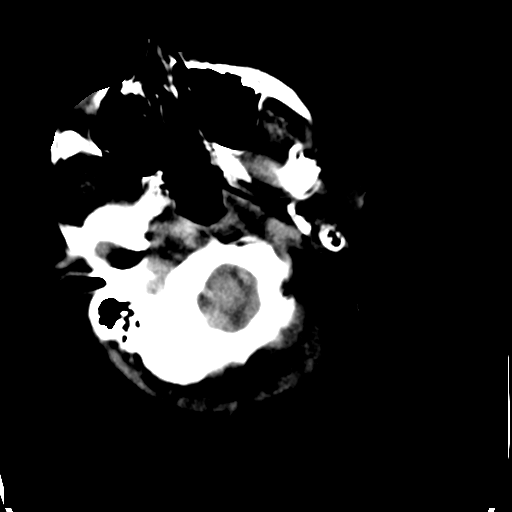
[im 3/30  bone]
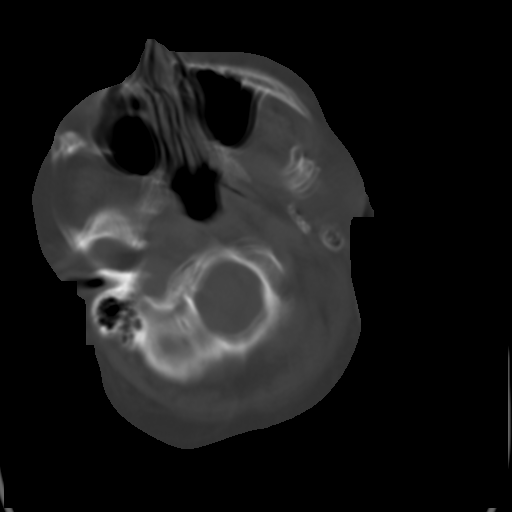
[im 7/30  brain]
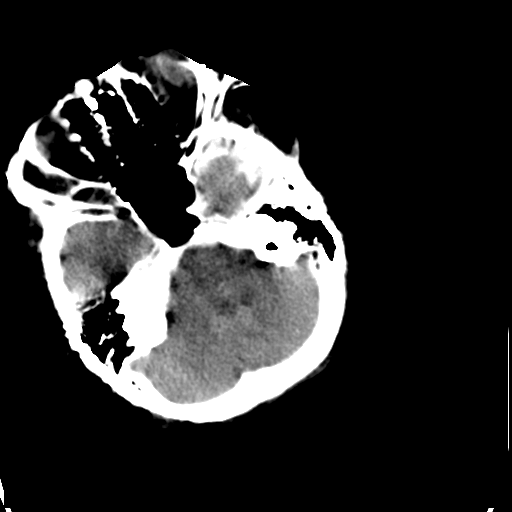
[im 9/30  brain]
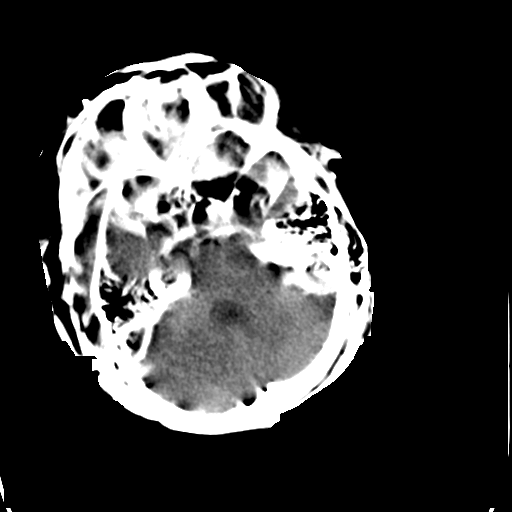
[im 13/30  brain]
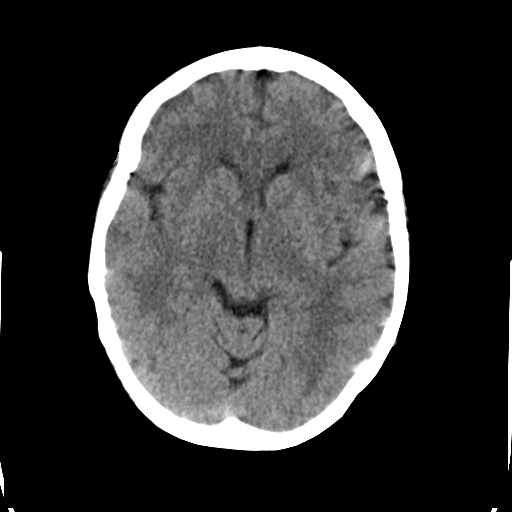
[im 15/30  brain]
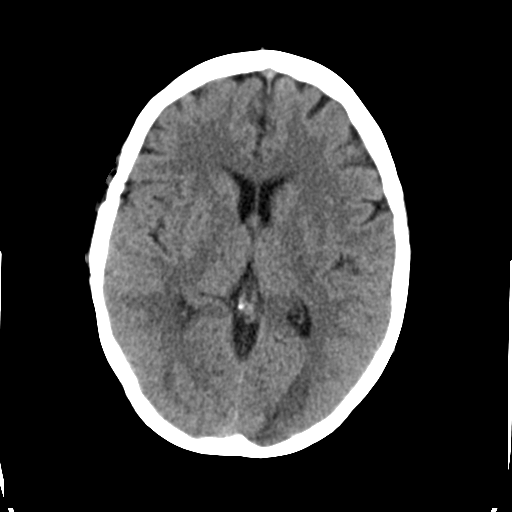
[im 15/30  bone]
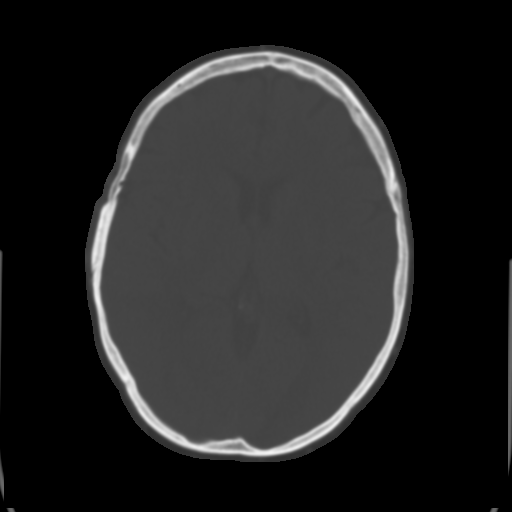
[im 17/30  brain]
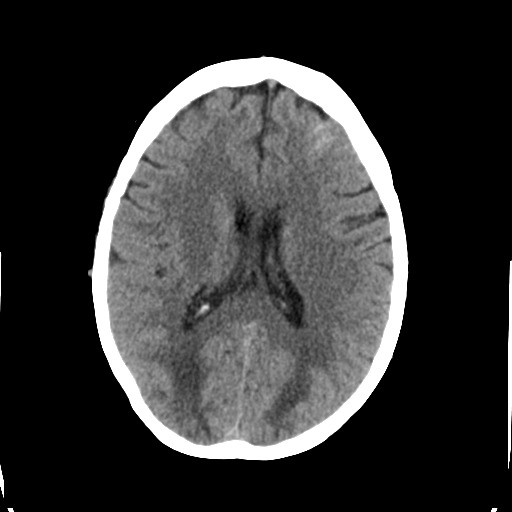
[im 21/30  brain]
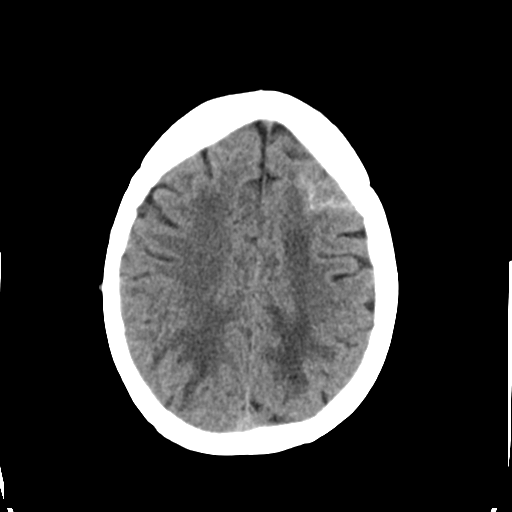
[im 23/30  brain]
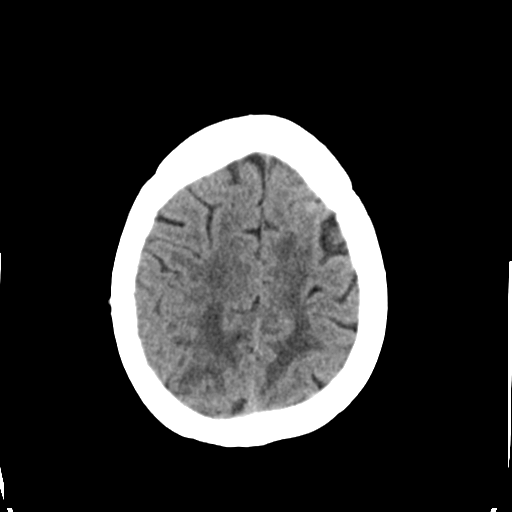
[im 27/30  brain]
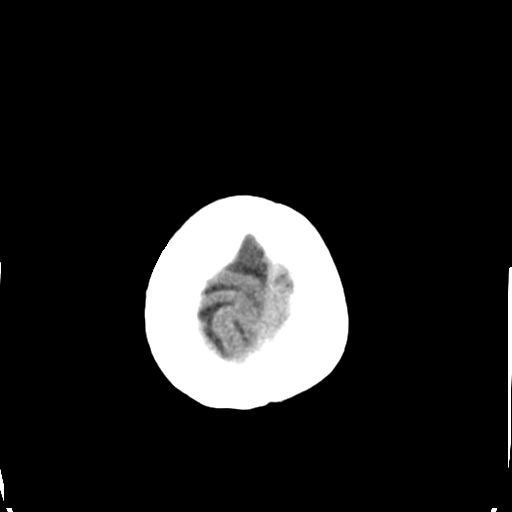
[im 27/30  bone]
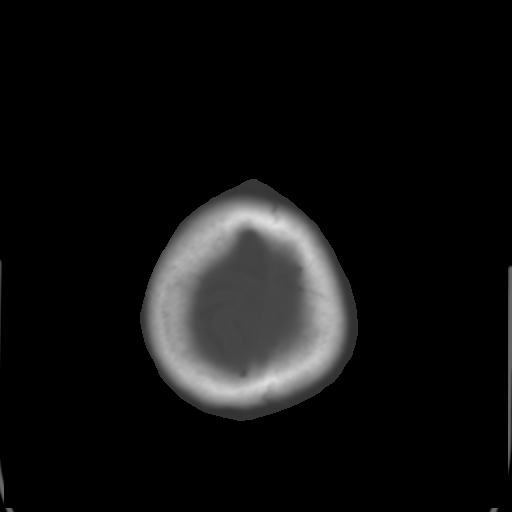

[Series 5: head 3.0 mpr · sagittal · 0.29mm/px · 3 of 49 slices shown (1 of 2)]
[im 10/49  brain]
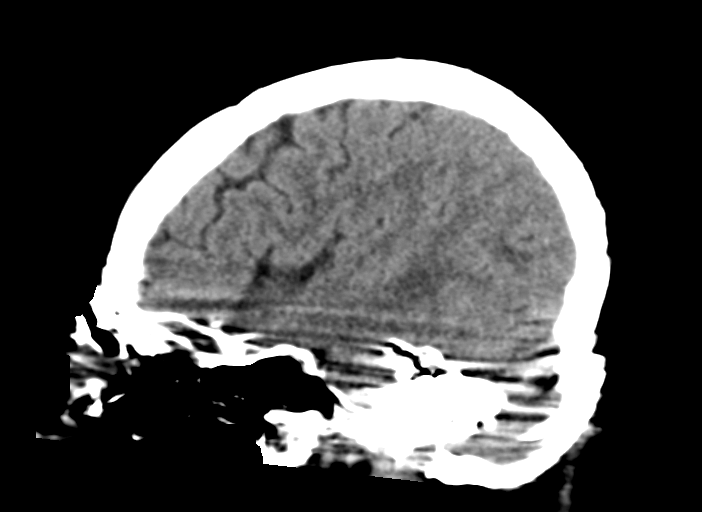
[im 20/49  brain]
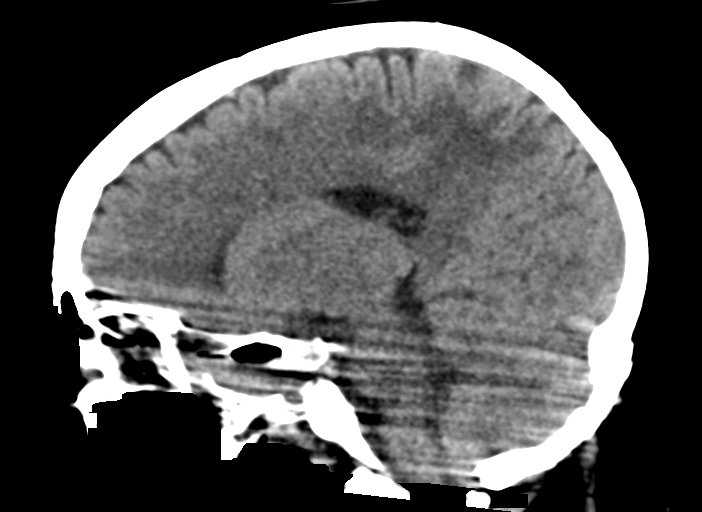
[im 29/49  brain]
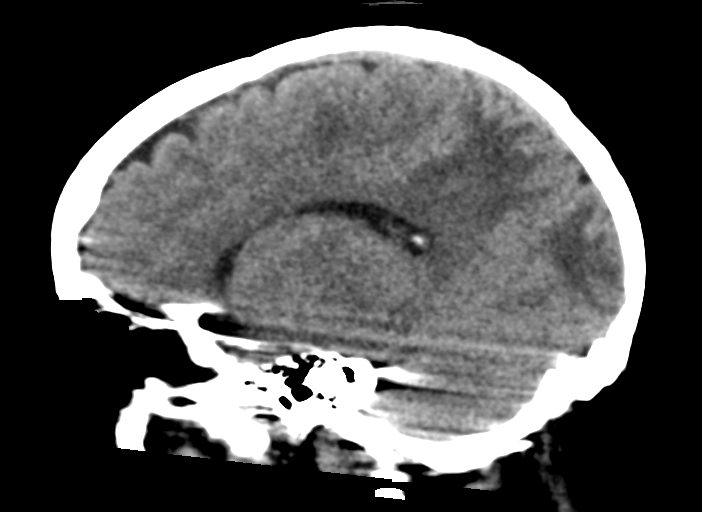

[Series 8: head 3.0 mpr · coronal · 0.30mm/px · 3 of 57 slices shown (2 of 2)]
[im 15/57  brain]
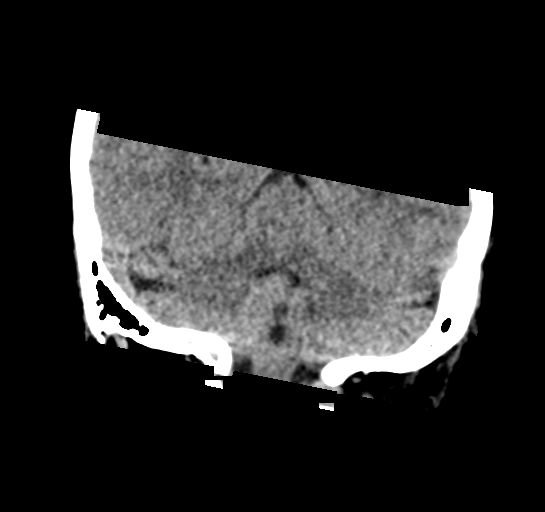
[im 29/57  brain]
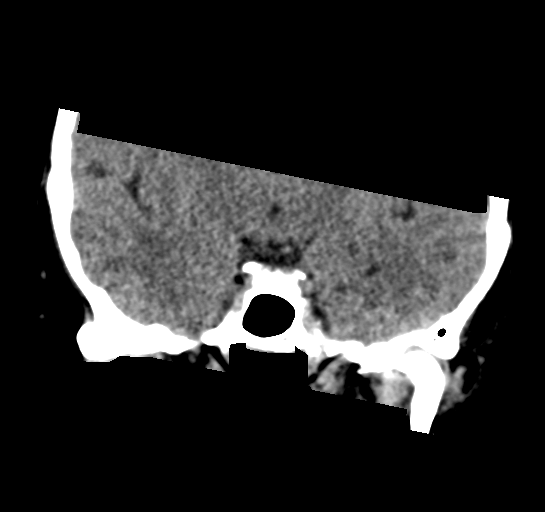
[im 43/57  brain]
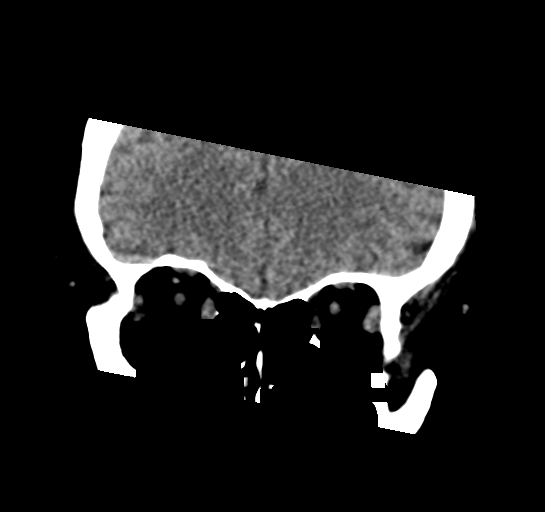

[15 of 47 positions shown; findings below may reference images not displayed]

FINDINGS: The examination suffers from motion but repeat imaging was
performed.

Again demonstrated is vasogenic edema within the white matter of
both cerebral hemispheres extending from the occipital regions to
the posterior frontal regions consistent with the clinical diagnosis
of posterior reversible encephalopathy. Subarachnoid hemorrhage in
the left frontal region is again demonstrated, not increased. No
intraparenchymal hemorrhage. No mass effect or shift. No extra-axial
collection. The calvarium is unremarkable. Sinuses, middle ears and
mastoids are clear.
IMPRESSION: No change since yesterday. Vasogenic edema within the hemispheric
white matter with a pattern consistent with the clinical diagnosis
of posterior reversible encephalopathy. Subarachnoid hemorrhage in
the left frontal region, unchanged.

## 2018-01-14 ENCOUNTER — Emergency Department (HOSPITAL_COMMUNITY): Payer: Self-pay

## 2018-01-14 ENCOUNTER — Other Ambulatory Visit: Payer: Self-pay

## 2018-01-14 ENCOUNTER — Inpatient Hospital Stay (HOSPITAL_COMMUNITY)
Admission: EM | Admit: 2018-01-14 | Discharge: 2018-01-19 | DRG: 190 | Disposition: A | Discharge status: Home / Self Care | Payer: Self-pay | Attending: Internal Medicine | Admitting: Internal Medicine

## 2018-01-14 DIAGNOSIS — I472 Ventricular tachycardia, unspecified: Secondary | ICD-10-CM

## 2018-01-14 DIAGNOSIS — I952 Hypotension due to drugs: Secondary | ICD-10-CM | POA: Diagnosis present

## 2018-01-14 DIAGNOSIS — J9622 Acute and chronic respiratory failure with hypercapnia: Secondary | ICD-10-CM | POA: Diagnosis present

## 2018-01-14 DIAGNOSIS — Z5329 Procedure and treatment not carried out because of patient's decision for other reasons: Secondary | ICD-10-CM | POA: Diagnosis not present

## 2018-01-14 DIAGNOSIS — K0889 Other specified disorders of teeth and supporting structures: Secondary | ICD-10-CM | POA: Diagnosis present

## 2018-01-14 DIAGNOSIS — F419 Anxiety disorder, unspecified: Secondary | ICD-10-CM | POA: Diagnosis present

## 2018-01-14 DIAGNOSIS — M549 Dorsalgia, unspecified: Secondary | ICD-10-CM | POA: Diagnosis present

## 2018-01-14 DIAGNOSIS — F40240 Claustrophobia: Secondary | ICD-10-CM | POA: Diagnosis present

## 2018-01-14 DIAGNOSIS — I110 Hypertensive heart disease with heart failure: Secondary | ICD-10-CM | POA: Diagnosis present

## 2018-01-14 DIAGNOSIS — Z716 Tobacco abuse counseling: Secondary | ICD-10-CM

## 2018-01-14 DIAGNOSIS — E1165 Type 2 diabetes mellitus with hyperglycemia: Secondary | ICD-10-CM | POA: Diagnosis present

## 2018-01-14 DIAGNOSIS — J9611 Chronic respiratory failure with hypoxia: Secondary | ICD-10-CM | POA: Diagnosis present

## 2018-01-14 DIAGNOSIS — F17200 Nicotine dependence, unspecified, uncomplicated: Secondary | ICD-10-CM | POA: Diagnosis present

## 2018-01-14 DIAGNOSIS — R569 Unspecified convulsions: Secondary | ICD-10-CM

## 2018-01-14 DIAGNOSIS — T4275XA Adverse effect of unspecified antiepileptic and sedative-hypnotic drugs, initial encounter: Secondary | ICD-10-CM | POA: Diagnosis present

## 2018-01-14 DIAGNOSIS — E785 Hyperlipidemia, unspecified: Secondary | ICD-10-CM | POA: Diagnosis present

## 2018-01-14 DIAGNOSIS — J9621 Acute and chronic respiratory failure with hypoxia: Secondary | ICD-10-CM | POA: Diagnosis present

## 2018-01-14 DIAGNOSIS — J439 Emphysema, unspecified: Principal | ICD-10-CM | POA: Diagnosis present

## 2018-01-14 DIAGNOSIS — I2510 Atherosclerotic heart disease of native coronary artery without angina pectoris: Secondary | ICD-10-CM | POA: Diagnosis present

## 2018-01-14 DIAGNOSIS — G40909 Epilepsy, unspecified, not intractable, without status epilepticus: Secondary | ICD-10-CM | POA: Diagnosis present

## 2018-01-14 DIAGNOSIS — J441 Chronic obstructive pulmonary disease with (acute) exacerbation: Secondary | ICD-10-CM | POA: Diagnosis present

## 2018-01-14 DIAGNOSIS — R079 Chest pain, unspecified: Secondary | ICD-10-CM

## 2018-01-14 DIAGNOSIS — T380X5A Adverse effect of glucocorticoids and synthetic analogues, initial encounter: Secondary | ICD-10-CM | POA: Diagnosis present

## 2018-01-14 DIAGNOSIS — R0789 Other chest pain: Secondary | ICD-10-CM

## 2018-01-14 DIAGNOSIS — E119 Type 2 diabetes mellitus without complications: Secondary | ICD-10-CM

## 2018-01-14 DIAGNOSIS — Z6826 Body mass index (BMI) 26.0-26.9, adult: Secondary | ICD-10-CM

## 2018-01-14 DIAGNOSIS — I10 Essential (primary) hypertension: Secondary | ICD-10-CM | POA: Diagnosis present

## 2018-01-14 DIAGNOSIS — Z79891 Long term (current) use of opiate analgesic: Secondary | ICD-10-CM

## 2018-01-14 DIAGNOSIS — J449 Chronic obstructive pulmonary disease, unspecified: Secondary | ICD-10-CM

## 2018-01-14 DIAGNOSIS — A419 Sepsis, unspecified organism: Secondary | ICD-10-CM | POA: Diagnosis present

## 2018-01-14 DIAGNOSIS — Z7984 Long term (current) use of oral hypoglycemic drugs: Secondary | ICD-10-CM

## 2018-01-14 DIAGNOSIS — Z79899 Other long term (current) drug therapy: Secondary | ICD-10-CM

## 2018-01-14 DIAGNOSIS — Z8679 Personal history of other diseases of the circulatory system: Secondary | ICD-10-CM

## 2018-01-14 DIAGNOSIS — G4700 Insomnia, unspecified: Secondary | ICD-10-CM | POA: Diagnosis present

## 2018-01-14 DIAGNOSIS — I252 Old myocardial infarction: Secondary | ICD-10-CM

## 2018-01-14 DIAGNOSIS — F32A Depression, unspecified: Secondary | ICD-10-CM | POA: Diagnosis present

## 2018-01-14 DIAGNOSIS — R630 Anorexia: Secondary | ICD-10-CM | POA: Diagnosis present

## 2018-01-14 DIAGNOSIS — F17210 Nicotine dependence, cigarettes, uncomplicated: Secondary | ICD-10-CM | POA: Diagnosis present

## 2018-01-14 DIAGNOSIS — E872 Acidosis: Secondary | ICD-10-CM | POA: Diagnosis not present

## 2018-01-14 DIAGNOSIS — G9341 Metabolic encephalopathy: Secondary | ICD-10-CM | POA: Diagnosis present

## 2018-01-14 DIAGNOSIS — Z8673 Personal history of transient ischemic attack (TIA), and cerebral infarction without residual deficits: Secondary | ICD-10-CM

## 2018-01-14 DIAGNOSIS — I2583 Coronary atherosclerosis due to lipid rich plaque: Secondary | ICD-10-CM

## 2018-01-14 DIAGNOSIS — I639 Cerebral infarction, unspecified: Secondary | ICD-10-CM | POA: Diagnosis present

## 2018-01-14 DIAGNOSIS — Z7951 Long term (current) use of inhaled steroids: Secondary | ICD-10-CM

## 2018-01-14 DIAGNOSIS — G8929 Other chronic pain: Secondary | ICD-10-CM | POA: Diagnosis present

## 2018-01-14 DIAGNOSIS — K219 Gastro-esophageal reflux disease without esophagitis: Secondary | ICD-10-CM | POA: Diagnosis present

## 2018-01-14 DIAGNOSIS — Z7982 Long term (current) use of aspirin: Secondary | ICD-10-CM

## 2018-01-14 DIAGNOSIS — I4729 Other ventricular tachycardia: Secondary | ICD-10-CM

## 2018-01-14 DIAGNOSIS — F329 Major depressive disorder, single episode, unspecified: Secondary | ICD-10-CM | POA: Diagnosis present

## 2018-01-14 DIAGNOSIS — I5033 Acute on chronic diastolic (congestive) heart failure: Secondary | ICD-10-CM | POA: Diagnosis present

## 2018-01-14 DIAGNOSIS — R339 Retention of urine, unspecified: Secondary | ICD-10-CM | POA: Diagnosis present

## 2018-01-14 DIAGNOSIS — Z955 Presence of coronary angioplasty implant and graft: Secondary | ICD-10-CM

## 2018-01-14 HISTORY — DX: Type 2 diabetes mellitus without complications: E11.9

## 2018-01-14 LAB — BASIC METABOLIC PANEL
Anion gap: 17 — ABNORMAL HIGH (ref 5–15)
BUN: 17 mg/dL (ref 6–20)
CALCIUM: 10 mg/dL (ref 8.9–10.3)
CO2: 33 mmol/L — ABNORMAL HIGH (ref 22–32)
CREATININE: 0.66 mg/dL (ref 0.44–1.00)
Chloride: 87 mmol/L — ABNORMAL LOW (ref 98–111)
GFR calc non Af Amer: >60 mL/min (ref >60)
Glucose, Bld: 292 mg/dL — ABNORMAL HIGH (ref 70–99)
Potassium: 3.8 mmol/L (ref 3.5–5.1)
SODIUM: 137 mmol/L (ref 135–145)

## 2018-01-14 LAB — I-STAT CHEM 8, ED
BUN: 24 mg/dL — AB (ref 6–20)
CHLORIDE: 90 mmol/L — AB (ref 98–111)
CREATININE: 0.5 mg/dL (ref 0.44–1.00)
Calcium, Ion: 1.16 mmol/L (ref 1.15–1.40)
Glucose, Bld: 233 mg/dL — ABNORMAL HIGH (ref 70–99)
HEMATOCRIT: 55 % — AB (ref 36.0–46.0)
Hemoglobin: 18.7 g/dL — ABNORMAL HIGH (ref 12.0–15.0)
POTASSIUM: 4.1 mmol/L (ref 3.5–5.1)
Sodium: 136 mmol/L (ref 135–145)
TCO2: 38 mmol/L — ABNORMAL HIGH (ref 22–32)

## 2018-01-14 LAB — HCG, QUANTITATIVE, PREGNANCY: hCG, Beta Chain, Quant, S: 14 m[IU]/mL — ABNORMAL HIGH (ref <5)

## 2018-01-14 LAB — CBC
HCT: 56.6 % — ABNORMAL HIGH (ref 36.0–46.0)
Hemoglobin: 17.9 g/dL — ABNORMAL HIGH (ref 12.0–15.0)
MCH: 30.6 pg (ref 26.0–34.0)
MCHC: 31.6 g/dL (ref 30.0–36.0)
MCV: 96.8 fL (ref 78.0–100.0)
PLATELETS: 318 10*3/uL (ref 150–400)
RBC: 5.85 MIL/uL — ABNORMAL HIGH (ref 3.87–5.11)
RDW: 14.3 % (ref 11.5–15.5)
WBC: 12.5 10*3/uL — AB (ref 4.0–10.5)

## 2018-01-14 LAB — I-STAT BETA HCG BLOOD, ED (MC, WL, AP ONLY): I-stat hCG, quantitative: 21.7 m[IU]/mL — ABNORMAL HIGH (ref <5)

## 2018-01-14 LAB — I-STAT CG4 LACTIC ACID, ED: Lactic Acid, Venous: 2.45 mmol/L (ref 0.5–1.9)

## 2018-01-14 LAB — I-STAT TROPONIN, ED: TROPONIN I, POC: 0 ng/mL (ref 0.00–0.08)

## 2018-01-14 LAB — BRAIN NATRIURETIC PEPTIDE: B Natriuretic Peptide: 18.6 pg/mL (ref 0.0–100.0)

## 2018-01-14 MED ORDER — AMIODARONE HCL IN DEXTROSE 360-4.14 MG/200ML-% IV SOLN: 30.0000 mg/h | INTRAVENOUS | Status: DC

## 2018-01-14 MED ORDER — NITROGLYCERIN 0.4 MG SL SUBL
0.4000 mg | SUBLINGUAL_TABLET | SUBLINGUAL | Status: DC | PRN
  Administered 2018-01-14: 0.4 mg via SUBLINGUAL
  Filled 2018-01-14: qty 1

## 2018-01-14 MED ORDER — IOPAMIDOL (ISOVUE-370) INJECTION 76%
INTRAVENOUS | Status: AC
  Filled 2018-01-14: qty 100

## 2018-01-14 MED ORDER — AMIODARONE HCL IN DEXTROSE 360-4.14 MG/200ML-% IV SOLN: 60.0000 mg/h | INTRAVENOUS | Status: DC

## 2018-01-14 MED ORDER — LEVETIRACETAM 750 MG PO TABS
750.0000 mg | ORAL_TABLET | Freq: Once | ORAL | Status: DC
  Filled 2018-01-14: qty 1

## 2018-01-14 MED ORDER — SODIUM CHLORIDE 0.9 % IV BOLUS
1000.0000 mL | Freq: Once | INTRAVENOUS | Status: AC
  Administered 2018-01-14: 1000 mL via INTRAVENOUS

## 2018-01-14 MED ORDER — AMIODARONE LOAD VIA INFUSION
150.0000 mg | Freq: Once | INTRAVENOUS | Status: DC
  Filled 2018-01-14: qty 83.34

## 2018-01-14 MED ORDER — IOPAMIDOL (ISOVUE-370) INJECTION 76%
100.0000 mL | Freq: Once | INTRAVENOUS | Status: AC | PRN
  Administered 2018-01-14: 100 mL via INTRAVENOUS

## 2018-01-14 MED ORDER — MORPHINE SULFATE (PF) 4 MG/ML IV SOLN
4.0000 mg | Freq: Once | INTRAVENOUS | Status: AC
  Administered 2018-01-14: 4 mg via INTRAVENOUS
  Filled 2018-01-14: qty 1

## 2018-01-14 NOTE — ED Triage Notes (Addendum)
Pt reports CP radiating to jaw and back. Pt reports hx of MI with same symptoms. Pt currently ST with rate of 137. Pt reports hx of seizures, on Keppra. Pt reports she thinks she possibly had a seizure around 1pm, she woke up, felt disoriented and loss control of her bladder. Pt reports she took ASA and 1 nitro at 3 pm.

## 2018-01-14 NOTE — Consult Note (Signed)
Cardiology Consultation Note    Patient ID: Joan Newman, MRN: [id number], DOB/AGE: 56-Aug-1962 56 y.o. Admit date: 01/14/2018   Date of Consult: 01/14/2018 Primary Physician: Lonie Peak, PA-C  Reason for Consultation: Chest pain, NSVT Requesting MD: Dr.  Silverio Lay  HPI: Joan Newman is a 57 y.o. female with a history of coronary artery disease status post LAD PCI, COPD, hypertension, seizure disorder, who presents with chest pain.  The patient reports she has had relatively constant chest pain for the last 1 to 2 days, that has been worsening in intensity over the last day.  She has had the symptoms intermittently over the last week to 10 days, and has presented to the [facility] ED twice for evaluation since September 14.  Both times, she had a ruled out for MI, and was treated with steroids for component of a COPD exacerbation.  The patient does report some worsening of her somewhat chronic shortness of breath.  She also mentioned that she had a syncopal event versus a seizure this afternoon.  She lost urinary continence during the episode.  Given her ongoing symptoms and possible seizure/syncope, the patient presented to the [facility] ED this evening.  In the ED, she was tachycardic and hypertensive upon presentation.  She was also mildly hypoxemic requiring nasal cannula.  Her initial labs showed normal creatinine, bicarbonate of 38, white count of 12.5, hematocrit of 55, and normal initial troponin.  Her ECG showed sinus tachycardia without acute ischemic changes.  She was noted to have 2 brief runs of NSVT (5-6 beats each) on the monitor.  The patient is being admitted to the medicine service for further evaluation.   Past Medical History:  Diagnosis Date  . Anxiety   . Asthma   . Coronary artery disease   . Depression   . Hypertension   . Seizures (HCC)   . Stroke [facility])       Surgical History:  Past Surgical History:  Procedure Laterality Date  . CESAREAN SECTION        Home Meds: Prior to Admission medications   Medication Sig Start Date End Date Taking? Authorizing Provider  acetaminophen (TYLENOL) 500 MG tablet Take 500-1,000 mg by mouth every 6 (six) hours as needed (for pain).   Yes [provider]  albuterol (PROVENTIL HFA;VENTOLIN HFA) 108 (90 Base) MCG/ACT inhaler Inhale 2 puffs into the lungs every 4 (four) hours as needed for wheezing or shortness of breath.   Yes [provider]  aspirin 81 MG chewable tablet Chew 81 mg by mouth daily. 03/20/10  Yes [provider]  atorvastatin (LIPITOR) 80 MG tablet Take 1 tablet (80 mg total) by mouth every evening. 11/27/15  Yes Rodolph Bong, MD  buPROPion [facility] SR) 150 MG 12 hr tablet Take 150 mg by mouth every morning.    Yes [provider]  celecoxib (CELEBREX) 100 MG capsule Take 100 mg by mouth 2 (two) times daily.   Yes [provider]  clonazePAM (KLONOPIN) 1 MG tablet Take 1 mg by mouth 2 (two) times daily.  10/08/15  Yes [provider]  cyclobenzaprine (FLEXERIL) 5 MG tablet Take 1 tablet (5 mg total) by mouth 3 (three) times daily as needed for muscle spasms. 12/09/15  Yes Charlynne Pander, MD  docusate sodium (COLACE) 100 MG capsule Take 100 mg by mouth daily as needed for mild constipation.   Yes [provider]  Fluticasone-Salmeterol (ADVAIR DISKUS) 250-50 MCG/DOSE AEPB Inhale 2 puffs into  the lungs 2 (two) times daily. 11/27/15  Yes Rodolph Bong, MD  gabapentin (NEURONTIN) 600 MG tablet Take 600 mg by mouth 2 (two) times daily. 01/11/18 02/12/18 Yes [provider]  GLIPIZIDE PO Take 1 tablet by mouth daily before breakfast.   Yes [provider]  isosorbide mononitrate (IMDUR) 30 MG 24 hr tablet Take 1 tablet (30 mg total) by mouth daily. 11/27/15  Yes Rodolph Bong, MD  K-TAB 20 MEQ TBCR Take 3 tablets by mouth daily. Patient taking differently: Take 60 mEq by mouth daily.  11/27/15  Yes Rodolph Bong, MD  levETIRAcetam (KEPPRA) 500 MG tablet Take 1 tablet (500 mg total) by mouth 2 (two) times daily. 11/27/15  Yes Rodolph Bong, MD  lisinopril (PRINIVIL,ZESTRIL) 20 MG tablet Take 1 tablet (20 mg total) by mouth 2 (two) times daily. 11/27/15  Yes Rodolph Bong, MD  metFORMIN (GLUCOPHAGE) 500 MG tablet Take 500 mg by mouth 2 (two) times daily.   Yes [provider]  metoprolol tartrate (LOPRESSOR) 25 MG tablet Take 1 tablet (25 mg total) by mouth 2 (two) times daily. 11/27/15  Yes Rodolph Bong, MD  nitroGLYCERIN (NITROSTAT) 0.4 MG SL tablet Place 1 tablet (0.4 mg total) under the tongue every 5 (five) minutes as needed for chest pain. 11/27/15  Yes Rodolph Bong, MD  pantoprazole (PROTONIX) 40 MG tablet Take 1 tablet (40 mg total) by mouth 2 (two) times daily before a meal. 11/27/15  Yes Rodolph Bong, MD  polyethylene glycol [facility] / GLYCOLAX) packet Take 17 g by mouth daily as needed for mild constipation.   Yes [provider]  PRESCRIPTION MEDICATION Prednisone taper pack (strength not verifiable): Take as directed   Yes [provider]  QUEtiapine (SEROQUEL) 100 MG tablet Take 100 mg by mouth at bedtime.   Yes [provider]  tiotropium (SPIRIVA) 18 MCG inhalation capsule Place 18 mcg into inhaler and inhale daily as needed (for flares).   Yes [provider]  zolpidem (AMBIEN) 10 MG tablet Take 1 tablet (10 mg total) by mouth at bedtime as needed for sleep. Patient taking differently: Take 10 mg by mouth at bedtime.  11/27/15  Yes Rodolph Bong, MD  HYDROcodone-acetaminophen (NORCO/VICODIN) 5-325 MG tablet Take 1 tablet by mouth every 6 (six) hours as needed. Patient not taking: Reported on 01/14/2018 12/09/15   Charlynne Pander, MD  tobramycin (TOBREX) 0.3 % ophthalmic ointment Place into both eyes 3 (three) times daily. Patient not taking: Reported on 01/14/2018 11/27/15   Rodolph Bong, MD  traMADol (ULTRAM) 50  MG tablet Take 2 tablets (100 mg total) by mouth every 6 (six) hours as needed for moderate pain. Patient not taking: Reported on 01/14/2018 11/27/15   Rodolph Bong, MD    Inpatient Medications:  . levETIRAcetam  750 mg Oral Once     Allergies: No Known Allergies  Social History   Socioeconomic History  . Marital status: Widowed    Spouse name: Not on file  . Number of children: Not on file  . Years of education: Not on file  . Highest education level: Not on file  Occupational History  . Not on file  Social Needs  . Financial resource strain: Not on file  . Food insecurity:    Worry: Not on file    Inability: Not on file  . Transportation needs:    Medical: Not on file    Non-medical: Not on  file  Tobacco Use  . Smoking status: Current Every Day Smoker    Packs/day: 2.00    Types: Cigarettes  . Smokeless tobacco: Current User  Substance and Sexual Activity  . Alcohol use: No    Alcohol/week: 0.0 standard drinks  . Drug use: Not on file  . Sexual activity: Not on file  Lifestyle  . Physical activity:    Days per week: Not on file    Minutes per session: Not on file  . Stress: Not on file  Relationships  . Social connections:    Talks on phone: Not on file    Gets together: Not on file    Attends religious service: Not on file    Active member of club or organization: Not on file    Attends meetings of clubs or organizations: Not on file    Relationship status: Not on file  . Intimate partner violence:    Fear of current or ex partner: Not on file    Emotionally abused: Not on file    Physically abused: Not on file    Forced sexual activity: Not on file  Other Topics Concern  . Not on file  Social History Narrative  . Not on file     Family History  Adopted: Yes     Review of Systems: All other systems reviewed and are otherwise negative except as noted above.  Labs: No results for input(s): CKTOTAL, CKMB, TROPONINI in the last 72 hours. Lab  Results  Component Value Date   WBC 12.5 (H) 01/14/2018   HGB 18.7 (H) 01/14/2018   HCT 55.0 (H) 01/14/2018   MCV 96.8 01/14/2018   PLT 318 01/14/2018    Recent Labs  Lab 01/14/18 2004 01/14/18 2228  NA 137 136  K 3.8 4.1  CL 87* 90*  CO2 33*  --   BUN 17 24*  CREATININE 0.66 0.50  CALCIUM 10.0  --   GLUCOSE 292* 233*   Lab Results  Component Value Date   CHOL 139 11/25/2015   HDL 42 11/25/2015   LDLCALC 65 11/25/2015   TRIG 158 (H) 11/25/2015   No results found for: DDIMER  Radiology/Studies:  Ct Head Wo Contrast  Result Date: 01/14/2018 CLINICAL DATA:  Chest pain radiating to the jaw and back. Altered level of consciousness. History of seizures. EXAM: CT HEAD WITHOUT CONTRAST TECHNIQUE: Contiguous axial images were obtained from the base of the skull through the vertex without intravenous contrast. COMPARISON:  04/08/2017 FINDINGS: Brain: No evidence of acute infarction, hemorrhage, hydrocephalus, extra-axial collection or mass lesion/mass effect. Vascular: Mild intracranial arterial vascular calcifications are present. Skull: Calvarium appears intact. Sinuses/Orbits: Paranasal sinuses and mastoid air cells are clear. Other: None. IMPRESSION: No acute intracranial abnormality. Electronically Signed   By: Burman Nieves M.D.   On: 01/14/2018 22:55   Ct Angio Chest Pe W And/or Wo Contrast  Result Date: 01/14/2018 CLINICAL DATA:  Chest pain radiating to the jaw on back. Previous history of myocardial infarct with similar symptoms. EXAM: CT ANGIOGRAPHY CHEST WITH CONTRAST TECHNIQUE: Multidetector CT imaging of the chest was performed using the standard protocol during bolus administration of intravenous contrast. Multiplanar CT image reconstructions and MIPs were obtained to evaluate the vascular anatomy. CONTRAST:  59 mL Isovue 370 COMPARISON:  12/09/2015 FINDINGS: Cardiovascular: There is good opacification of the central and segmental pulmonary arteries. No focal filling  defects. No evidence of significant pulmonary embolus. Normal caliber thoracic aorta with minimal scattered calcifications. No aortic dissection.  Normal heart size. No pericardial effusions. Mediastinum/Nodes: Esophagus is decompressed. No significant lymphadenopathy in the chest. Thyroid gland is unremarkable. Lungs/Pleura: Scarring in the lung bases is progressing since previous study. Small focal area of infiltration demonstrated in the anterior left lingula. This may indicate a focal area of pneumonia or developing scarring. Patchy emphysematous changes in the lungs. Peribronchial thickening suggesting chronic bronchitis. No pleural effusions. No pneumothorax. Airways are patent. Upper Abdomen: No acute abnormalities identified. Musculoskeletal: Superior endplate compression deformities at T11 and T12 with Schmorl's nodes. Appearance is chronic with degenerative changes present. There is progression since the previous study at T12. Review of the MIP images confirms the above findings. IMPRESSION: 1. No evidence of significant pulmonary embolus. 2. Scarring in the lung bases is progressing since previous study. Small focal area of infiltration in the anterior left lingula may indicate a focal area of pneumonia or developing scarring. 3. Patchy emphysematous changes in the lungs. 4. Superior endplate compression deformities at T11 and T12 with Schmorl's nodes, progressed since previous study but with nonacute appearance. Aortic Atherosclerosis (ICD10-I70.0) and Emphysema (ICD10-J43.9). Electronically Signed   By: Burman Nieves M.D.   On: 01/14/2018 23:03   Dg Chest Port 1 View  Result Date: 01/14/2018 CLINICAL DATA:  Chest pain radiating to jaw and back. Similar symptoms with prior myocardial infarction. Smoker. EXAM: PORTABLE CHEST 1 VIEW COMPARISON:  Chest radiograph April 08, 2017 FINDINGS: Cardiomediastinal silhouette is normal. Mild calcific atherosclerosis aortic arch. LEFT upper lung zone  granuloma. No pleural effusions or focal consolidations. Chronic interstitial changes. Bibasilar strandy densities. Trachea projects midline and there is no pneumothorax. Soft tissue planes and included osseous structures are non-suspicious. IMPRESSION: 1. Minimal bibasilar atelectasis/scarring. 2.  Aortic Atherosclerosis (ICD10-I70.0). 3.  Emphysema (ICD10-J43.9). Electronically Signed   By: Awilda Metro M.D.   On: 01/14/2018 21:00    Wt Readings from Last 3 Encounters:  01/14/18 65.8 kg  12/08/15 70.3 kg  11/27/15 70 kg    EKG: Sinus tachycardia, biatrial enlargement.  No acute ischemic changes.  Physical Exam: Blood pressure 117/71, pulse (!) 119, temperature 98.9 F (37.2 C), temperature source Oral, resp. rate 20, height 5\' 2"  (1.575 m), weight 65.8 kg, SpO2 94 %. Body mass index is 26.52 kg/m. General: Well developed, well nourished, in no acute distress. Head: Normocephalic, atraumatic, sclera non-icteric, no xanthomas, nares are without discharge.  Neck: Negative for carotid bruits. JVD not elevated. Lungs: Prolonged expiratory phase, occasional wheezes throughout. Heart: Tachycardic, regular. No murmurs, rubs, or gallops appreciated. Abdomen: Soft, non-tender, non-distended with normoactive bowel sounds. No hepatomegaly. No rebound/guarding. No obvious abdominal masses. Msk:  Strength and tone appear normal for age. Extremities: No clubbing or cyanosis. No edema.  Distal pedal pulses are 2+ and equal bilaterally. Neuro: Alert and oriented X 3. No facial asymmetry. No focal deficit. Moves all extremities spontaneously. Psych:  Responds to questions appropriately with a normal affect.     Assessment and Plan  57 year old lady with known coronary artery disease and COPD who presents with nearly constant chest pain for 24 hours.  Fortunately her EKG does not show any evidence of acute ischemia and her initial troponin is normal.  This is very reassuring from an ACS perspective.   However, we would plan to continue monitoring on telemetry and cycle serial biomarkers.  Her episode of syncope/seizure is somewhat less clear.  While she did have some mild nonsustained VT on the monitor, I think that the likelihood of a primary arrhythmic event is quite low.  Would continue to follow electrolytes and aggressively supplement to maintain K greater than 4 and magnesium greater than 2.  Would plan to repeat echocardiogram in the morning.  Agree with admission to the medicine service.  She may have some component of COPD exacerbation that requires ongoing treatment.  Cardiology will follow along with you.  Signed, Esmond PlantsJaidip Casy Brunetto, MD 01/14/2018, 11:46 PM

## 2018-01-14 NOTE — H&P (Signed)
History and Physical    Joan Newman ZOX:[id number] DOB: 07-06-1960 DOA: 01/14/2018  Referring MD/NP/PA:   PCP: Lonie Peakonroy, Nathan, PA-C   Patient coming from:  The patient is coming from home.  At baseline, pt is independent for most of ADL.   Chief Complaint: chest pain and possible seizure  HPI: Joan Newman is a [age] y.o. female with medical history significant of COPD, stroke, GERD, depression with anxiety, CAD, seizure, S AH, tobacco abuse, who presents with chest pain and a possible seizure.  Patient states that he has been having some chest discomfort for more than 10 days. Since yesterday her chest pain becomes more constant. It is located in substernal area, severe, constant, pressure-like, radiating to the left jaw, left shoulder.  [facility] for evaluation since September 14.  Both times, she had ruled out for MI, and was treated with steroids for component of COPD exacerbation. Patient states that he always has some cough and shortness of breath due to COPD, which is slightly worse than baseline.  She has clear mucus production, but no fever or chills. Patient has nausea, but denies vomiting, diarrhea or abdominal pain.  No symptoms of UTI. Pt states that she may have had episode of seizure at about 1:00 PM. She had confusion and lost urinary continence during the episode. She denies unilateral weakness, numbness or tingling his extremities.  No facial droop or slurred speech. Per report, pt was noted to have 2 brief runs of NSVT (5-6 beats each) on the monitor in ED.    ED Course: pt was found to have WBC 12.5, lactic acid of 2.45, BNP 18.6, electrolytes renal function okay, tachycardia with heart rate up to 136, tachypnea, oxygen saturation 89% on room air, temperature normal, electrolytes renal function okay.  CT head is negative for acute intracranial abnormalities.  Patient is placed on telemetry bed for observation.  Cardiology Dr. Allena Katzhakravartti was  consulted.  CT angiogram chest  1. Negative for PE 2. Scarring in the lung bases is progressing since previous study. small focal area of infiltration in the anterior left lingula may indicate a focal area of pneumonia or developing scarring. 3. Patchy emphysematous changes in the lungs.  4. Superior endplate compression deformities at T11 and T12 with Schmorl's nodes, progressed since previous study but with nonacute appearance  Review of Systems:   General: no fevers, chills, no body weight gain, has poor appetite, has fatigue HEENT: no blurry vision, hearing changes or sore throat Respiratory: has dyspnea, coughing, no wheezing CV: has chest pain, no palpitations GI: has nausea, no vomiting, abdominal pain, diarrhea, constipation GU: no dysuria, burning on urination, increased urinary frequency, hematuria  Ext: no leg edema Neuro: no unilateral weakness, numbness, or tingling, no vision change or hearing loss. Had possible seizure. Skin: no rash, no skin tear. MSK: No muscle spasm, no deformity, no limitation of range of movement in spin Heme: No easy bruising.  Travel history: No recent long distant travel.  Allergy: No Known Allergies  Past Medical History:  Diagnosis Date  . Anxiety   . Asthma   . Coronary artery disease   . Depression   . Hypertension   . Seizures (HCC)   . Stroke [facility](HCC)     Past Surgical History:  Procedure Laterality Date  . CESAREAN SECTION      Social History:  reports that she has been smoking cigarettes. She has been smoking about 2.00 packs per day. She uses smokeless  tobacco. She reports that she does not drink alcohol. Her drug history is not on file.  Family History:  Family History  Adopted: Yes     Prior to Admission medications   Medication Sig Start Date End Date Taking? Authorizing Provider  acetaminophen (TYLENOL) 500 MG tablet Take 500-1,000 mg by mouth every 6 (six) hours as needed (for pain).   Yes [provider]    albuterol (PROVENTIL HFA;VENTOLIN HFA) 108 (90 Base) MCG/ACT inhaler Inhale 2 puffs into the lungs every 4 (four) hours as needed for wheezing or shortness of breath.   Yes [provider]  aspirin 81 MG chewable tablet Chew 81 mg by mouth daily. 03/20/10  Yes [provider]  atorvastatin (LIPITOR) 80 MG tablet Take 1 tablet (80 mg total) by mouth every evening. 11/27/15  Yes Rodolph Bong, MD  buPROPion [facility] SR) 150 MG 12 hr tablet Take 150 mg by mouth every morning.    Yes [provider]  celecoxib (CELEBREX) 100 MG capsule Take 100 mg by mouth 2 (two) times daily.   Yes [provider]  clonazePAM (KLONOPIN) 1 MG tablet Take 1 mg by mouth 2 (two) times daily.  10/08/15  Yes [provider]  cyclobenzaprine (FLEXERIL) 5 MG tablet Take 1 tablet (5 mg total) by mouth 3 (three) times daily as needed for muscle spasms. 12/09/15  Yes Charlynne Pander, MD  docusate sodium (COLACE) 100 MG capsule Take 100 mg by mouth daily as needed for mild constipation.   Yes [provider]  Fluticasone-Salmeterol (ADVAIR DISKUS) 250-50 MCG/DOSE AEPB Inhale 2 puffs into the lungs 2 (two) times daily. 11/27/15  Yes Rodolph Bong, MD  gabapentin (NEURONTIN) 600 MG tablet Take 600 mg by mouth 2 (two) times daily. 01/11/18 02/12/18 Yes [provider]  GLIPIZIDE PO Take 1 tablet by mouth daily before breakfast.   Yes [provider]  isosorbide mononitrate (IMDUR) 30 MG 24 hr tablet Take 1 tablet (30 mg total) by mouth daily. 11/27/15  Yes Rodolph Bong, MD  K-TAB 20 MEQ TBCR Take 3 tablets by mouth daily. Patient taking differently: Take 60 mEq by mouth daily.  11/27/15  Yes Rodolph Bong, MD  levETIRAcetam (KEPPRA) 500 MG tablet Take 1 tablet (500 mg total) by mouth 2 (two) times daily. 11/27/15  Yes Rodolph Bong, MD  lisinopril (PRINIVIL,ZESTRIL) 20 MG tablet Take 1 tablet (20 mg total) by mouth 2 (two) times daily. 11/27/15   Yes Rodolph Bong, MD  metFORMIN (GLUCOPHAGE) 500 MG tablet Take 500 mg by mouth 2 (two) times daily.   Yes [provider]  metoprolol tartrate (LOPRESSOR) 25 MG tablet Take 1 tablet (25 mg total) by mouth 2 (two) times daily. 11/27/15  Yes Rodolph Bong, MD  nitroGLYCERIN (NITROSTAT) 0.4 MG SL tablet Place 1 tablet (0.4 mg total) under the tongue every 5 (five) minutes as needed for chest pain. 11/27/15  Yes Rodolph Bong, MD  pantoprazole (PROTONIX) 40 MG tablet Take 1 tablet (40 mg total) by mouth 2 (two) times daily before a meal. 11/27/15  Yes Rodolph Bong, MD  polyethylene glycol [facility] / GLYCOLAX) packet Take 17 g by mouth daily as needed for mild constipation.   Yes [provider]  PRESCRIPTION MEDICATION Prednisone taper pack (strength not verifiable): Take as directed   Yes [provider]  QUEtiapine (SEROQUEL) 100 MG tablet Take 100 mg by mouth at bedtime.   Yes [provider]  tiotropium (SPIRIVA) 18 MCG inhalation capsule Place 18 mcg into inhaler and inhale daily as needed (for flares).   Yes [provider]  zolpidem (AMBIEN) 10 MG tablet Take 1 tablet (10 mg total) by mouth at bedtime as needed for sleep. Patient taking differently: Take 10 mg by mouth at bedtime.  11/27/15  Yes Rodolph Bong, MD  HYDROcodone-acetaminophen (NORCO/VICODIN) 5-325 MG tablet Take 1 tablet by mouth every 6 (six) hours as needed. Patient not taking: Reported on 01/14/2018 12/09/15   Charlynne Pander, MD  tobramycin (TOBREX) 0.3 % ophthalmic ointment Place into both eyes 3 (three) times daily. Patient not taking: Reported on 01/14/2018 11/27/15   Rodolph Bong, MD  traMADol (ULTRAM) 50 MG tablet Take 2 tablets (100 mg total) by mouth every 6 (six) hours as needed for moderate pain. Patient not taking: Reported on 01/14/2018 11/27/15   Rodolph Bong, MD    Physical Exam: Vitals:   01/15/18 0030 01/15/18 0133 01/15/18 0343 01/15/18  0444  BP: 133/74 (!) 148/82  (!) 87/52  Pulse: (!) 114 (!) 108  81  Resp: 18 18  16   Temp:  98.2 F (36.8 C)  98 F (36.7 C)  TempSrc:  Oral    SpO2: 92% 92% 91% 94%  Weight:  68 kg    Height:  5\' 2"  (1.575 m)     General: Not in acute distress HEENT:       Eyes: PERRL, EOMI, no scleral icterus.       ENT: No discharge from the ears and nose, no pharynx injection, no tonsillar enlargement.        Neck: No JVD, no bruit, no mass felt. Heme: No neck lymph node enlargement. Cardiac: S1/S2, RRR, No murmurs, No gallops or rubs. Respiratory: Decreased air movement bilaterally. No rales, wheezing, rhonchi or rubs. GI: Soft, nondistended, nontender, no rebound pain, no organomegaly, BS present. GU: No hematuria Ext: No pitting leg edema bilaterally. 2+DP/PT pulse bilaterally. Musculoskeletal: No joint deformities, No joint redness or warmth, no limitation of ROM in spin. Skin: No rashes.  Neuro: Alert, oriented X3, cranial nerves II-XII grossly intact, moves all extremities normally.  Psych: Patient is not psychotic, no suicidal or hemocidal ideation.  Labs on Admission: I have personally reviewed following labs and imaging studies  CBC: Recent Labs  Lab 01/14/18 2004 01/14/18 2228 01/15/18 0039  WBC 12.5*  --  12.2*  HGB 17.9* 18.7* 16.2*  HCT 56.6* 55.0* 52.0*  MCV 96.8  --  97.7  PLT 318  --  254   Basic Metabolic Panel: Recent Labs  Lab 01/14/18 2004 01/14/18 2228 01/15/18 0039  NA 137 136  --   K 3.8 4.1  --   CL 87* 90*  --   CO2 33*  --   --   GLUCOSE 292* 233*  --   BUN 17 24*  --   CREATININE 0.66 0.50  --   CALCIUM 10.0  --   --   MG  --   --  1.5*   GFR: Estimated Creatinine Clearance: 71 mL/min (by C-G formula based on SCr of 0.5 mg/dL). Liver Function Tests: No results for input(s): AST, ALT, ALKPHOS, BILITOT, PROT, ALBUMIN in the last 168 hours. No results for input(s): LIPASE, AMYLASE in the last 168 hours. No results for input(s): AMMONIA in the  last 168 hours. Coagulation Profile: No results for input(s): INR, PROTIME in the last 168 hours. Cardiac Enzymes: Recent Labs  Lab 01/15/18 0039  TROPONINI <0.03   BNP (last 3 results) No results for input(s): PROBNP in the last 8760 hours. HbA1C: No results for input(s): HGBA1C in the last 72 hours. CBG: No results for input(s): GLUCAP in the last 168 hours. Lipid Profile: No results for input(s): CHOL, HDL, LDLCALC, TRIG, CHOLHDL, LDLDIRECT in the last 72 hours. Thyroid Function Tests: No results for input(s): TSH, T4TOTAL, FREET4, T3FREE, THYROIDAB in the last 72 hours. Anemia Panel: No results for input(s): VITAMINB12, FOLATE, FERRITIN, TIBC, IRON, RETICCTPCT in the last 72 hours. Urine analysis:    Component Value Date/Time   COLORURINE YELLOW 12/09/2015 0445   APPEARANCEUR CLEAR 12/09/2015 0445   LABSPEC >1.046 (H) 12/09/2015 0445   PHURINE 5.0 12/09/2015 0445   GLUCOSEU NEGATIVE 12/09/2015 0445   HGBUR NEGATIVE 12/09/2015 0445   BILIRUBINUR NEGATIVE 12/09/2015 0445   KETONESUR NEGATIVE 12/09/2015 0445   PROTEINUR NEGATIVE 12/09/2015 0445   NITRITE NEGATIVE 12/09/2015 0445   LEUKOCYTESUR NEGATIVE 12/09/2015 0445   Sepsis Labs: @LABRCNTIP (procalcitonin:4,lacticidven:4) )No results found for this or any previous visit (from the past 240 hour(s)).   Radiological Exams on Admission: Ct Head Wo Contrast  Result Date: 01/14/2018 CLINICAL DATA:  Chest pain radiating to the jaw and back. Altered level of consciousness. History of seizures. EXAM: CT HEAD WITHOUT CONTRAST TECHNIQUE: Contiguous axial images were obtained from the base of the skull through the vertex without intravenous contrast. COMPARISON:  04/08/2017 FINDINGS: Brain: No evidence of acute infarction, hemorrhage, hydrocephalus, extra-axial collection or mass lesion/mass effect. Vascular: Mild intracranial arterial vascular calcifications are present. Skull: Calvarium appears intact. Sinuses/Orbits: Paranasal  sinuses and mastoid air cells are clear. Other: None. IMPRESSION: No acute intracranial abnormality. Electronically Signed   By: Burman Nieves M.D.   On: 01/14/2018 22:55   Ct Angio Chest Pe W And/or Wo Contrast  Result Date: 01/14/2018 CLINICAL DATA:  Chest pain radiating to the jaw on back. Previous history of myocardial infarct with similar symptoms. EXAM: CT ANGIOGRAPHY CHEST WITH CONTRAST TECHNIQUE: Multidetector CT imaging of the chest was performed using the standard protocol during bolus administration of intravenous contrast. Multiplanar CT image reconstructions and MIPs were obtained to evaluate the vascular anatomy. CONTRAST:  59 mL Isovue 370 COMPARISON:  12/09/2015 FINDINGS: Cardiovascular: There is good opacification of the central and segmental pulmonary arteries. No focal filling defects. No evidence of significant pulmonary embolus. Normal caliber thoracic aorta with minimal scattered calcifications. No aortic dissection. Normal heart size. No pericardial effusions. Mediastinum/Nodes: Esophagus is decompressed. No significant lymphadenopathy in the chest. Thyroid gland is unremarkable. Lungs/Pleura: Scarring in the lung bases is progressing since previous study. Small focal area of infiltration demonstrated in the anterior left lingula. This may indicate a focal area of pneumonia or developing scarring. Patchy emphysematous changes in the lungs. Peribronchial thickening suggesting chronic bronchitis. No pleural effusions. No pneumothorax. Airways are patent. Upper Abdomen: No acute abnormalities identified. Musculoskeletal: Superior endplate compression deformities at T11 and T12 with Schmorl's nodes. Appearance is chronic with degenerative changes present. There is progression since the previous study at T12. Review of the MIP images confirms the above findings. IMPRESSION: 1. No evidence of significant pulmonary embolus. 2. Scarring in the lung bases is progressing since previous study.  Small focal area of infiltration in the anterior left lingula may indicate a focal area of pneumonia or developing scarring. 3. Patchy emphysematous changes in the lungs. 4. Superior endplate compression deformities at T11 and T12 with Schmorl's nodes, progressed since previous study but with nonacute appearance. Aortic Atherosclerosis (ICD10-I70.0)  and Emphysema (ICD10-J43.9). Electronically Signed   By: Burman Nieves M.D.   On: 01/14/2018 23:03   Dg Chest Port 1 View  Result Date: 01/14/2018 CLINICAL DATA:  Chest pain radiating to jaw and back. Similar symptoms with prior myocardial infarction. Smoker. EXAM: PORTABLE CHEST 1 VIEW COMPARISON:  Chest radiograph April 08, 2017 FINDINGS: Cardiomediastinal silhouette is normal. Mild calcific atherosclerosis aortic arch. LEFT upper lung zone granuloma. No pleural effusions or focal consolidations. Chronic interstitial changes. Bibasilar strandy densities. Trachea projects midline and there is no pneumothorax. Soft tissue planes and included osseous structures are non-suspicious. IMPRESSION: 1. Minimal bibasilar atelectasis/scarring. 2.  Aortic Atherosclerosis (ICD10-I70.0). 3.  Emphysema (ICD10-J43.9). Electronically Signed   By: Awilda Metro M.D.   On: 01/14/2018 21:00     EKG: Independently reviewed.  Sinus rhythm, QTC 439, tachycardia, low voltage, bilateral atrial enlargement   Assessment/Plan Principal Problem:   Chest pain Active Problems:   COPD exacerbation (HCC)   Essential hypertension   Tobacco use disorder   CAD (coronary artery disease)   Hyperlipidemia   Anxiety   Depression   Seizure (HCC)   Stroke (HCC)   GERD (gastroesophageal reflux disease)   Chronic respiratory failure with hypoxia (HCC)   NSVT (nonsustained ventricular tachycardia) (HCC)   Sepsis (HCC)   Hypomagnesemia   Chest pain, hx of CAD and NSVT: initial trop negative.  Cardiology, Dr. Allena Katz was consulted. Per Dr. Allena Katz, likelihood of a  primary arrhythmic event is quite low. He recommended to trend enzema, get 2d echo, monitor electrolytes and aggressively supplement to maintain K greater than 4 and magnesium greater than 2.    -will place on tele bed for obs -Highly appreciate Dr. Ramond Craver recommendations. -Give 2 g of magnesium sulfate (magnesium is 1.5 on admission). - cycle CE q6 x3 and repeat EKG in the am  - prn Nitroglycerin, Morphine, and aspirin, lipitor, imdur - Risk factor stratification: will check FLP and A1C  - 2d echo  COPD exacerbation and chronic respiratory failure with hypoxia and sepsis: Patient has slightly worsening shortness of breath, cough, decreased air movement bilaterally, indicating mild COPD exacerbation. CT angiogram showed a small focal area of infiltration in the anterior left lingula, but no fever, clinically does not seem to have pneumonia.  Patient meets criteria for sepsis with leukocytosis, tachycardia and tachypnea.  Lactic acid is elevated at 2.45.  Currently hemodynamically stable.  -Nebulizers: scheduled Atrovent and prn Xopenex Nebs - Dulera inhaler -Solu-Medrol 60 mg IV bid -Z pak -Mucinex for cough  -Incentive spirometry -Follow up blood culture x2 -Nasal cannula oxygen as needed to maintain O2 saturation 92% or greater -will get Procalcitonin and trend lactic acid levels per sepsis protocol. -IVF: 2.5 L of NS bolus in ED, followed by 75 cc/h   Essential hypertension: -Continue lisinopril, metoprolol -IV hydralazine as needed  Tobacco abuse: -Did counseling about importance of quitting smoking -Nicotine patch  Hyperlipidemia: -lipitor  Seizure: pt is on 500 mg of Keppra at home. He seems to have had episode of seizure today. CT-head negative. EDP discussed with urology, Dr. Amada Jupiter, who recommend to increase keppra to 750 mg BID. -Seizure precaution -When necessary Ativan for seizure -will give one dose of IV Keppra  750 mg, then continue 750 mg twice daily  orally -check Keppra level  Stroke: -Continue aspirin, Lipitor  Depression and anxiety: Stable, no suicidal or homicidal ideations. -Continue home medications  GERD: -Protonix  Hypomagnesemia: Mag 1.5 -Repleted   DVT ppx: SQ Lovenox Code Status: Full  code Family Communication: None at bed side.  Disposition Plan:  Anticipate discharge back to previous home environment Consults called: Cardiology, Dr. Allena Katz Admission status: Obs / tele   Date of Service 01/15/2018    Lorretta Harp [facility] Pager [phone]  If 7PM-7AM, please contact night-coverage [email or web address] Password [email or web address] 01/15/2018, 4:55 AM

## 2018-01-14 NOTE — ED Provider Notes (Addendum)
[facility] EMERGENCY DEPARTMENT Provider Note   CSN: [id number] Arrival date & time: 01/14/18  1940     History   Chief Complaint Chief Complaint  Patient presents with  . Chest Pain    HPI Joan Newman is a 57 y.o. female history of CAD status post stent, hypertension, previous seizures here presenting with jaw pain, back pain.  Patient states that she had some chest pain and jaw pain since yesterday.  She states that around 1 PM, patient may have had a seizure.  She states that she did not remember what happened and did lose control of her bladder.  It was not witnessed by anyone.  Patient states that this is similar to her previous heart attack so she came in for evaluation. Patient was seen at [facility] ED and had normal trop x 2 several days ago.   The history is provided by the patient.    Past Medical History:  Diagnosis Date  . Anxiety   . Asthma   . Coronary artery disease   . Depression   . Hypertension   . Seizures (HCC)   . Stroke [facility])     Patient Active Problem List   Diagnosis Date Noted  . Stroke (HCC) 01/14/2018  . GERD (gastroesophageal reflux disease) 01/14/2018  . Chest pain 11/25/2015  . Headache   . Pain in the chest 11/23/2015  . Tobacco use disorder 11/23/2015  . CAD (coronary artery disease) 11/23/2015  . Hyperlipidemia 11/23/2015  . Anxiety 11/23/2015  . Depression 11/23/2015  . Seizures (HCC) 11/23/2015  . Subarachnoid bleed (HCC) 11/23/2015  . PRES (posterior reversible encephalopathy syndrome) 11/08/2015  . Chronic obstructive pulmonary disease (HCC)   . Essential hypertension   . Tick bite     Past Surgical History:  Procedure Laterality Date  . CESAREAN SECTION       OB History   None      Home Medications    Prior to Admission medications   Medication Sig Start Date End Date Taking? Authorizing Provider  acetaminophen (TYLENOL) 500 MG tablet Take 500-1,000 mg by mouth every 6 (six) hours as needed  (for pain).   Yes [provider]  albuterol (PROVENTIL HFA;VENTOLIN HFA) 108 (90 Base) MCG/ACT inhaler Inhale 2 puffs into the lungs every 4 (four) hours as needed for wheezing or shortness of breath.   Yes [provider]  aspirin 81 MG chewable tablet Chew 81 mg by mouth daily. 03/20/10  Yes [provider]  atorvastatin (LIPITOR) 80 MG tablet Take 1 tablet (80 mg total) by mouth every evening. 11/27/15  Yes Rodolph Bong, MD  buPROPion [facility] SR) 150 MG 12 hr tablet Take 150 mg by mouth every morning.    Yes [provider]  celecoxib (CELEBREX) 100 MG capsule Take 100 mg by mouth 2 (two) times daily.   Yes [provider]  clonazePAM (KLONOPIN) 1 MG tablet Take 1 mg by mouth 2 (two) times daily.  10/08/15  Yes [provider]  cyclobenzaprine (FLEXERIL) 5 MG tablet Take 1 tablet (5 mg total) by mouth 3 (three) times daily as needed for muscle spasms. 12/09/15  Yes Charlynne Pander, MD  docusate sodium (COLACE) 100 MG capsule Take 100 mg by mouth daily as needed for mild constipation.   Yes [provider]  Fluticasone-Salmeterol (ADVAIR DISKUS) 250-50 MCG/DOSE AEPB Inhale 2 puffs into the lungs 2 (two) times daily. 11/27/15  Yes Rodolph Bong, MD  gabapentin (NEURONTIN) 600  MG tablet Take 600 mg by mouth 2 (two) times daily. 01/11/18 02/12/18 Yes [provider]  GLIPIZIDE PO Take 1 tablet by mouth daily before breakfast.   Yes [provider]  isosorbide mononitrate (IMDUR) 30 MG 24 hr tablet Take 1 tablet (30 mg total) by mouth daily. 11/27/15  Yes Rodolph Bong, MD  K-TAB 20 MEQ TBCR Take 3 tablets by mouth daily. Patient taking differently: Take 60 mEq by mouth daily.  11/27/15  Yes Rodolph Bong, MD  levETIRAcetam (KEPPRA) 500 MG tablet Take 1 tablet (500 mg total) by mouth 2 (two) times daily. 11/27/15  Yes Rodolph Bong, MD  lisinopril (PRINIVIL,ZESTRIL) 20 MG tablet Take 1 tablet (20 mg  total) by mouth 2 (two) times daily. 11/27/15  Yes Rodolph Bong, MD  metFORMIN (GLUCOPHAGE) 500 MG tablet Take 500 mg by mouth 2 (two) times daily.   Yes [provider]  metoprolol tartrate (LOPRESSOR) 25 MG tablet Take 1 tablet (25 mg total) by mouth 2 (two) times daily. 11/27/15  Yes Rodolph Bong, MD  nitroGLYCERIN (NITROSTAT) 0.4 MG SL tablet Place 1 tablet (0.4 mg total) under the tongue every 5 (five) minutes as needed for chest pain. 11/27/15  Yes Rodolph Bong, MD  pantoprazole (PROTONIX) 40 MG tablet Take 1 tablet (40 mg total) by mouth 2 (two) times daily before a meal. 11/27/15  Yes Rodolph Bong, MD  polyethylene glycol [facility] / GLYCOLAX) packet Take 17 g by mouth daily as needed for mild constipation.   Yes [provider]  PRESCRIPTION MEDICATION Prednisone taper pack (strength not verifiable): Take as directed   Yes [provider]  QUEtiapine (SEROQUEL) 100 MG tablet Take 100 mg by mouth at bedtime.   Yes [provider]  tiotropium (SPIRIVA) 18 MCG inhalation capsule Place 18 mcg into inhaler and inhale daily as needed (for flares).   Yes [provider]  zolpidem (AMBIEN) 10 MG tablet Take 1 tablet (10 mg total) by mouth at bedtime as needed for sleep. Patient taking differently: Take 10 mg by mouth at bedtime.  11/27/15  Yes Rodolph Bong, MD  HYDROcodone-acetaminophen (NORCO/VICODIN) 5-325 MG tablet Take 1 tablet by mouth every 6 (six) hours as needed. Patient not taking: Reported on 01/14/2018 12/09/15   Charlynne Pander, MD  tobramycin (TOBREX) 0.3 % ophthalmic ointment Place into both eyes 3 (three) times daily. Patient not taking: Reported on 01/14/2018 11/27/15   Rodolph Bong, MD  traMADol (ULTRAM) 50 MG tablet Take 2 tablets (100 mg total) by mouth every 6 (six) hours as needed for moderate pain. Patient not taking: Reported on 01/14/2018 11/27/15   Rodolph Bong, MD    Family History Family History    Adopted: Yes    Social History Social History   Tobacco Use  . Smoking status: Current Every Day Smoker    Packs/day: 2.00    Types: Cigarettes  . Smokeless tobacco: Current User  Substance Use Topics  . Alcohol use: No    Alcohol/week: 0.0 standard drinks  . Drug use: Not on file     Allergies   Patient has no known allergies.   Review of Systems Review of Systems  Cardiovascular: Positive for chest pain.  All other systems reviewed and are negative.    Physical Exam Updated Vital Signs BP 118/81   Pulse (!) 116   Temp 98.9 F (37.2 C) (Oral)   Resp 19   Ht 5\' 2"  (1.575  m)   Wt 65.8 kg   SpO2 91%   BMI 26.52 kg/m   Physical Exam  Constitutional: She is oriented to person, place, and time.  Uncomfortable   HENT:  Head: Normocephalic.  Eyes: Pupils are equal, round, and reactive to light. EOM are normal.  Neck: Normal range of motion.  Cardiovascular: Intact distal pulses and normal pulses.  Tachycardic, regular   Pulmonary/Chest: Effort normal and breath sounds normal.  Abdominal: Soft. Bowel sounds are normal.  Musculoskeletal: Normal range of motion.       Right lower leg: Normal.       Left lower leg: Normal.  Neurological: She is alert and oriented to person, place, and time.  Cn 2- 12 intact, nl strength throughout.   Skin: Skin is warm. Capillary refill takes less than 2 seconds.  Psychiatric: She has a normal mood and affect. Her behavior is normal.  Nursing note and vitals reviewed.    ED Treatments / Results  Labs (all labs ordered are listed, but only abnormal results are displayed) Labs Reviewed  BASIC METABOLIC PANEL - Abnormal; Notable for the following components:      Result Value   Chloride 87 (*)    CO2 33 (*)    Glucose, Bld 292 (*)    Anion gap 17 (*)    All other components within normal limits  CBC - Abnormal; Notable for the following components:   WBC 12.5 (*)    RBC 5.85 (*)    Hemoglobin 17.9 (*)    HCT 56.6 (*)     All other components within normal limits  HCG, QUANTITATIVE, PREGNANCY - Abnormal; Notable for the following components:   hCG, Beta Chain, Quant, S 14 (*)    All other components within normal limits  I-STAT BETA HCG BLOOD, ED ([facility], [facility], [facility] ONLY) - Abnormal; Notable for the following components:   I-stat hCG, quantitative 21.7 (*)    All other components within normal limits  I-STAT CHEM 8, ED - Abnormal; Notable for the following components:   Chloride 90 (*)    BUN 24 (*)    Glucose, Bld 233 (*)    TCO2 38 (*)    Hemoglobin 18.7 (*)    HCT 55.0 (*)    All other components within normal limits  I-STAT CG4 LACTIC ACID, ED - Abnormal; Notable for the following components:   Lactic Acid, Venous 2.45 (*)    All other components within normal limits  CULTURE, BLOOD (ROUTINE X 2)  CULTURE, BLOOD (ROUTINE X 2)  BRAIN NATRIURETIC PEPTIDE  PROCALCITONIN  I-STAT TROPONIN, ED    EKG EKG Interpretation  Date/Time:  Thursday January 14 2018 19:45:41 EDT Ventricular Rate:  136 PR Interval:  132 QRS Duration: 70 QT Interval:  292 QTC Calculation: 439 R Axis:   -68 Text Interpretation:  Sinus tachycardia Biatrial enlargement Left axis deviation Nonspecific ST abnormality Abnormal ECG rate faster than previous Confirmed by Richardean CanalYao, David H [phone]([id number]) on 01/14/2018 8:04:17 PM   Radiology Ct Head Wo Contrast  Result Date: 01/14/2018 CLINICAL DATA:  Chest pain radiating to the jaw and back. Altered level of consciousness. History of seizures. EXAM: CT HEAD WITHOUT CONTRAST TECHNIQUE: Contiguous axial images were obtained from the base of the skull through the vertex without intravenous contrast. COMPARISON:  04/08/2017 FINDINGS: Brain: No evidence of acute infarction, hemorrhage, hydrocephalus, extra-axial collection or mass lesion/mass effect. Vascular: Mild intracranial arterial vascular calcifications are present. Skull: Calvarium appears intact. Sinuses/Orbits: Paranasal sinuses and  mastoid  air cells are clear. Other: None. IMPRESSION: No acute intracranial abnormality. Electronically Signed   By: Burman Nieves M.D.   On: 01/14/2018 22:55   Ct Angio Chest Pe W And/or Wo Contrast  Result Date: 01/14/2018 CLINICAL DATA:  Chest pain radiating to the jaw on back. Previous history of myocardial infarct with similar symptoms. EXAM: CT ANGIOGRAPHY CHEST WITH CONTRAST TECHNIQUE: Multidetector CT imaging of the chest was performed using the standard protocol during bolus administration of intravenous contrast. Multiplanar CT image reconstructions and MIPs were obtained to evaluate the vascular anatomy. CONTRAST:  59 mL Isovue 370 COMPARISON:  12/09/2015 FINDINGS: Cardiovascular: There is good opacification of the central and segmental pulmonary arteries. No focal filling defects. No evidence of significant pulmonary embolus. Normal caliber thoracic aorta with minimal scattered calcifications. No aortic dissection. Normal heart size. No pericardial effusions. Mediastinum/Nodes: Esophagus is decompressed. No significant lymphadenopathy in the chest. Thyroid gland is unremarkable. Lungs/Pleura: Scarring in the lung bases is progressing since previous study. Small focal area of infiltration demonstrated in the anterior left lingula. This may indicate a focal area of pneumonia or developing scarring. Patchy emphysematous changes in the lungs. Peribronchial thickening suggesting chronic bronchitis. No pleural effusions. No pneumothorax. Airways are patent. Upper Abdomen: No acute abnormalities identified. Musculoskeletal: Superior endplate compression deformities at T11 and T12 with Schmorl's nodes. Appearance is chronic with degenerative changes present. There is progression since the previous study at T12. Review of the MIP images confirms the above findings. IMPRESSION: 1. No evidence of significant pulmonary embolus. 2. Scarring in the lung bases is progressing since previous study. Small focal area of  infiltration in the anterior left lingula may indicate a focal area of pneumonia or developing scarring. 3. Patchy emphysematous changes in the lungs. 4. Superior endplate compression deformities at T11 and T12 with Schmorl's nodes, progressed since previous study but with nonacute appearance. Aortic Atherosclerosis (ICD10-I70.0) and Emphysema (ICD10-J43.9). Electronically Signed   By: Burman Nieves M.D.   On: 01/14/2018 23:03   Dg Chest Port 1 View  Result Date: 01/14/2018 CLINICAL DATA:  Chest pain radiating to jaw and back. Similar symptoms with prior myocardial infarction. Smoker. EXAM: PORTABLE CHEST 1 VIEW COMPARISON:  Chest radiograph April 08, 2017 FINDINGS: Cardiomediastinal silhouette is normal. Mild calcific atherosclerosis aortic arch. LEFT upper lung zone granuloma. No pleural effusions or focal consolidations. Chronic interstitial changes. Bibasilar strandy densities. Trachea projects midline and there is no pneumothorax. Soft tissue planes and included osseous structures are non-suspicious. IMPRESSION: 1. Minimal bibasilar atelectasis/scarring. 2.  Aortic Atherosclerosis (ICD10-I70.0). 3.  Emphysema (ICD10-J43.9). Electronically Signed   By: Awilda Metro M.D.   On: 01/14/2018 21:00    Procedures Procedures (including critical care time)  Medications Ordered in ED Medications  nitroGLYCERIN (NITROSTAT) SL tablet 0.4 mg (0.4 mg Sublingual Given 01/14/18 2251)  levETIRAcetam (KEPPRA) tablet 750 mg (has no administration in time range)  sodium chloride 0.9 % bolus 1,000 mL (0 mLs Intravenous Stopped 01/14/18 2321)  iopamidol (ISOVUE-370) 76 % injection 100 mL (100 mLs Intravenous Contrast Given 01/14/18 2235)  morphine 4 MG/ML injection 4 mg (4 mg Intravenous Given 01/14/18 2328)     Initial Impression / Assessment and Plan / ED Course  I have reviewed the triage vital signs and the nursing notes.  Pertinent labs & imaging results that were available during my care of the  patient were reviewed by me and considered in my medical decision making (see chart for details).  CRITICAL CARE Performed by: Sandria Bales  Yao   Total critical care time: 30 minutes  Critical care time was exclusive of separately billable procedures and treating other patients.  Critical care was necessary to treat or prevent imminent or life-threatening deterioration.  Critical care was time spent personally by me on the following activities: development of treatment plan with patient and/or surrogate as well as nursing, discussions with consultants, evaluation of patient's response to treatment, examination of patient, obtaining history from patient or surrogate, ordering and performing treatments and interventions, ordering and review of laboratory studies, ordering and review of radiographic studies, pulse oximetry and re-evaluation of patient's condition.      Geneveive Furness is a 57 y.o. female here with palpitations, chest pain, possible seizure. Patient was noted to have 5 runs of Vtach in the ED that resolved by itself. I talked to Dr. Allena Katz from cardiology. He states that it is likely rate related bundle branch and wants to hold off on amiodarone and will see patient. Recommend workup with chemistry, r/o PE. Will get labs, trop, CTA chest.   12:08 AM WBC 12. Lactate 2.5. CTA showed possible pneumonia, no PE. Still tachycardic in the ED. Ordered abx. Cardiology will see patient as well. Dr. Clyde Lundborg recommended neurology consult. Dr. Amada Jupiter recommend increase keppra to 750 mg BID. Dr. Clyde Lundborg to admit for hypoxia, pneumonia, possible Vtach.    Final Clinical Impressions(s) / ED Diagnoses   Final diagnoses:  Other chest pain  V-tach [facility])  Seizure [facility])    ED Discharge Orders    None       Charlynne Pander, MD 01/15/18 0008    Charlynne Pander, MD 01/26/18 [phone]

## 2018-01-14 NOTE — ED Notes (Signed)
ED Provider at bedside. 

## 2018-01-15 ENCOUNTER — Encounter (HOSPITAL_COMMUNITY): Payer: Self-pay

## 2018-01-15 ENCOUNTER — Ambulatory Visit (HOSPITAL_COMMUNITY): Payer: Self-pay

## 2018-01-15 DIAGNOSIS — I5030 Unspecified diastolic (congestive) heart failure: Secondary | ICD-10-CM

## 2018-01-15 DIAGNOSIS — I472 Ventricular tachycardia: Secondary | ICD-10-CM

## 2018-01-15 DIAGNOSIS — I959 Hypotension, unspecified: Secondary | ICD-10-CM

## 2018-01-15 DIAGNOSIS — A419 Sepsis, unspecified organism: Secondary | ICD-10-CM | POA: Diagnosis present

## 2018-01-15 DIAGNOSIS — J9611 Chronic respiratory failure with hypoxia: Secondary | ICD-10-CM | POA: Diagnosis present

## 2018-01-15 DIAGNOSIS — E119 Type 2 diabetes mellitus without complications: Secondary | ICD-10-CM

## 2018-01-15 DIAGNOSIS — J441 Chronic obstructive pulmonary disease with (acute) exacerbation: Secondary | ICD-10-CM

## 2018-01-15 DIAGNOSIS — G9340 Encephalopathy, unspecified: Secondary | ICD-10-CM

## 2018-01-15 DIAGNOSIS — J9621 Acute and chronic respiratory failure with hypoxia: Secondary | ICD-10-CM

## 2018-01-15 DIAGNOSIS — I4729 Other ventricular tachycardia: Secondary | ICD-10-CM

## 2018-01-15 HISTORY — DX: Type 2 diabetes mellitus without complications: E11.9

## 2018-01-15 LAB — BLOOD GAS, ARTERIAL
Acid-Base Excess: 3.4 mmol/L — ABNORMAL HIGH (ref 0.0–2.0)
Acid-Base Excess: 2.4 mmol/L — ABNORMAL HIGH (ref 0.0–2.0)
Bicarbonate: 29.9 mmol/L — ABNORMAL HIGH (ref 20.0–28.0)
Bicarbonate: 28.9 mmol/L — ABNORMAL HIGH (ref 20.0–28.0)
Delivery systems: POSITIVE
Drawn by: 252031
Drawn by: 27407
Expiratory PAP: 6
FIO2: 32
FIO2: 40
Inspiratory PAP: 14
O2 Saturation: 93.1 %
O2 Saturation: 92.9 %
Patient temperature: 98.6
Patient temperature: 98.6
pCO2 arterial: 68.9 mmHg (ref 32.0–48.0)
pCO2 arterial: 66.6 mmHg (ref 32.0–48.0)
pH, Arterial: 7.26 — ABNORMAL LOW (ref 7.350–7.450)
pH, Arterial: 7.26 — ABNORMAL LOW (ref 7.350–7.450)
pO2, Arterial: 78 mmHg — ABNORMAL LOW (ref 83.0–108.0)
pO2, Arterial: 78.6 mmHg — ABNORMAL LOW (ref 83.0–108.0)

## 2018-01-15 LAB — RESPIRATORY PANEL BY PCR
Adenovirus: NOT DETECTED
BORDETELLA PERTUSSIS-RVPCR: NOT DETECTED
CHLAMYDOPHILA PNEUMONIAE-RVPPCR: NOT DETECTED
Coronavirus 229E: NOT DETECTED
Coronavirus HKU1: NOT DETECTED
Coronavirus NL63: NOT DETECTED
Coronavirus OC43: NOT DETECTED
INFLUENZA B-RVPPCR: NOT DETECTED
Influenza A: NOT DETECTED
MYCOPLASMA PNEUMONIAE-RVPPCR: NOT DETECTED
Metapneumovirus: NOT DETECTED
PARAINFLUENZA VIRUS 1-RVPPCR: NOT DETECTED
PARAINFLUENZA VIRUS 2-RVPPCR: NOT DETECTED
PARAINFLUENZA VIRUS 3-RVPPCR: NOT DETECTED
Parainfluenza Virus 4: NOT DETECTED
RESPIRATORY SYNCYTIAL VIRUS-RVPPCR: NOT DETECTED
Rhinovirus / Enterovirus: NOT DETECTED

## 2018-01-15 LAB — CBC
HEMATOCRIT: 52 % — AB (ref 36.0–46.0)
Hemoglobin: 16.2 g/dL — ABNORMAL HIGH (ref 12.0–15.0)
MCH: 30.5 pg (ref 26.0–34.0)
MCHC: 31.2 g/dL (ref 30.0–36.0)
MCV: 97.7 fL (ref 78.0–100.0)
Platelets: 254 10*3/uL (ref 150–400)
RBC: 5.32 MIL/uL — AB (ref 3.87–5.11)
RDW: 14.4 % (ref 11.5–15.5)
WBC: 12.2 10*3/uL — AB (ref 4.0–10.5)

## 2018-01-15 LAB — GLUCOSE, CAPILLARY
GLUCOSE-CAPILLARY: 265 mg/dL — AB (ref 70–99)
GLUCOSE-CAPILLARY: 216 mg/dL — AB (ref 70–99)
Glucose-Capillary: 195 mg/dL — ABNORMAL HIGH (ref 70–99)
Glucose-Capillary: 169 mg/dL — ABNORMAL HIGH (ref 70–99)
Glucose-Capillary: 179 mg/dL — ABNORMAL HIGH (ref 70–99)
Glucose-Capillary: 166 mg/dL — ABNORMAL HIGH (ref 70–99)
Glucose-Capillary: 286 mg/dL — ABNORMAL HIGH (ref 70–99)

## 2018-01-15 LAB — LIPID PANEL
Cholesterol: 110 mg/dL (ref 0–200)
HDL: 29 mg/dL — AB (ref >40)
LDL CALC: 37 mg/dL (ref 0–99)
TRIGLYCERIDES: 221 mg/dL — AB (ref <150)
Total CHOL/HDL Ratio: 3.8 RATIO
VLDL: 44 mg/dL — AB (ref 0–40)

## 2018-01-15 LAB — ECHOCARDIOGRAM COMPLETE
Height: 62 in
WEIGHTICAEL: 2398.4 [oz_av]

## 2018-01-15 LAB — MAGNESIUM: Magnesium: 1.5 mg/dL — ABNORMAL LOW (ref 1.7–2.4)

## 2018-01-15 LAB — RENAL FUNCTION PANEL
ANION GAP: 11 (ref 5–15)
Albumin: 2.8 g/dL — ABNORMAL LOW (ref 3.5–5.0)
BUN: 13 mg/dL (ref 6–20)
CHLORIDE: 104 mmol/L (ref 98–111)
CO2: 23 mmol/L (ref 22–32)
CREATININE: 0.52 mg/dL (ref 0.44–1.00)
Calcium: 7.9 mg/dL — ABNORMAL LOW (ref 8.9–10.3)
Glucose, Bld: 291 mg/dL — ABNORMAL HIGH (ref 70–99)
Phosphorus: 2.9 mg/dL (ref 2.5–4.6)
Potassium: 5 mmol/L (ref 3.5–5.1)
Sodium: 138 mmol/L (ref 135–145)

## 2018-01-15 LAB — HEMOGLOBIN A1C
Hgb A1c MFr Bld: 9 % — ABNORMAL HIGH (ref 4.8–5.6)
Mean Plasma Glucose: 211.6 mg/dL

## 2018-01-15 LAB — HIV ANTIBODY (ROUTINE TESTING W REFLEX): HIV SCREEN 4TH GENERATION: NONREACTIVE

## 2018-01-15 LAB — PROCALCITONIN

## 2018-01-15 LAB — RAPID URINE DRUG SCREEN, HOSP PERFORMED
Amphetamines: NOT DETECTED
Barbiturates: NOT DETECTED
Benzodiazepines: POSITIVE — AB
Cocaine: NOT DETECTED
Opiates: POSITIVE — AB
Tetrahydrocannabinol: NOT DETECTED

## 2018-01-15 LAB — TROPONIN I
Troponin I: <0.03 ng/mL (ref <0.03)
Troponin I: <0.03 ng/mL (ref <0.03)
Troponin I: <0.03 ng/mL (ref <0.03)

## 2018-01-15 LAB — MRSA PCR SCREENING: MRSA by PCR: NEGATIVE

## 2018-01-15 LAB — TSH: TSH: 2.92 u[IU]/mL (ref 0.350–4.500)

## 2018-01-15 LAB — LACTIC ACID, PLASMA: Lactic Acid, Venous: 1.9 mmol/L (ref 0.5–1.9)

## 2018-01-15 MED ORDER — LEVALBUTEROL HCL 1.25 MG/0.5ML IN NEBU
1.2500 mg | INHALATION_SOLUTION | Freq: Four times a day (QID) | RESPIRATORY_TRACT | Status: DC
  Administered 2018-01-15: 1.25 mg via RESPIRATORY_TRACT
  Filled 2018-01-15: qty 0.5

## 2018-01-15 MED ORDER — MOMETASONE FURO-FORMOTEROL FUM 200-5 MCG/ACT IN AERO
2.0000 | INHALATION_SPRAY | Freq: Two times a day (BID) | RESPIRATORY_TRACT | Status: DC
  Filled 2018-01-15: qty 8.8

## 2018-01-15 MED ORDER — INSULIN ASPART 100 UNIT/ML ~~LOC~~ SOLN
0.0000 [IU] | SUBCUTANEOUS | Status: DC
  Administered 2018-01-15: 5 [IU] via SUBCUTANEOUS
  Administered 2018-01-15: 3 [IU] via SUBCUTANEOUS
  Administered 2018-01-15: 5 [IU] via SUBCUTANEOUS
  Administered 2018-01-16 (×2): 3 [IU] via SUBCUTANEOUS
  Administered 2018-01-16: 2 [IU] via SUBCUTANEOUS
  Administered 2018-01-16: 3 [IU] via SUBCUTANEOUS

## 2018-01-15 MED ORDER — ONDANSETRON HCL 4 MG/2ML IJ SOLN
4.0000 mg | Freq: Four times a day (QID) | INTRAMUSCULAR | Status: DC | PRN
  Administered 2018-01-16 – 2018-01-19 (×5): 4 mg via INTRAVENOUS
  Filled 2018-01-15 (×6): qty 2

## 2018-01-15 MED ORDER — NALOXONE HCL 0.4 MG/ML IJ SOLN
0.4000 mg | INTRAMUSCULAR | Status: DC | PRN
  Administered 2018-01-15: 0.4 mg via INTRAVENOUS
  Filled 2018-01-15: qty 1

## 2018-01-15 MED ORDER — AZITHROMYCIN 500 MG PO TABS: 500.0000 mg | ORAL_TABLET | Freq: Every day | ORAL | Status: DC

## 2018-01-15 MED ORDER — SODIUM CHLORIDE 0.9 % IV BOLUS: 500.0000 mL | Freq: Once | INTRAVENOUS | Status: DC | PRN

## 2018-01-15 MED ORDER — QUETIAPINE FUMARATE 100 MG PO TABS
100.0000 mg | ORAL_TABLET | Freq: Every day | ORAL | Status: DC
  Administered 2018-01-15: 100 mg via ORAL
  Filled 2018-01-15: qty 1

## 2018-01-15 MED ORDER — SODIUM CHLORIDE 0.9 % IV BOLUS
500.0000 mL | Freq: Once | INTRAVENOUS | Status: AC
  Administered 2018-01-15: 500 mL via INTRAVENOUS

## 2018-01-15 MED ORDER — CHLORHEXIDINE GLUCONATE 0.12 % MT SOLN
15.0000 mL | Freq: Two times a day (BID) | OROMUCOSAL | Status: DC
  Administered 2018-01-15 – 2018-01-19 (×8): 15 mL via OROMUCOSAL
  Filled 2018-01-15 (×7): qty 15

## 2018-01-15 MED ORDER — ALPRAZOLAM 0.25 MG PO TABS: 0.2500 mg | ORAL_TABLET | Freq: Two times a day (BID) | ORAL | Status: DC | PRN

## 2018-01-15 MED ORDER — SODIUM CHLORIDE 0.9 % IV BOLUS
1500.0000 mL | Freq: Once | INTRAVENOUS | Status: AC
  Administered 2018-01-15: 1500 mL via INTRAVENOUS

## 2018-01-15 MED ORDER — LISINOPRIL 20 MG PO TABS
20.0000 mg | ORAL_TABLET | Freq: Two times a day (BID) | ORAL | Status: DC
  Administered 2018-01-15: 20 mg via ORAL
  Filled 2018-01-15: qty 1

## 2018-01-15 MED ORDER — CLONAZEPAM 1 MG PO TABS
1.0000 mg | ORAL_TABLET | Freq: Two times a day (BID) | ORAL | Status: DC
  Administered 2018-01-15: 1 mg via ORAL
  Filled 2018-01-15: qty 2

## 2018-01-15 MED ORDER — ISOSORBIDE MONONITRATE ER 30 MG PO TB24: 30.0000 mg | ORAL_TABLET | Freq: Every day | ORAL | Status: DC

## 2018-01-15 MED ORDER — BUPROPION HCL ER (SR) 150 MG PO TB12
150.0000 mg | ORAL_TABLET | Freq: Every morning | ORAL | Status: DC
  Filled 2018-01-15: qty 1

## 2018-01-15 MED ORDER — CELECOXIB 100 MG PO CAPS
100.0000 mg | ORAL_CAPSULE | Freq: Two times a day (BID) | ORAL | Status: DC
  Administered 2018-01-15: 100 mg via ORAL
  Filled 2018-01-15 (×2): qty 1

## 2018-01-15 MED ORDER — LEVETIRACETAM 750 MG PO TABS
750.0000 mg | ORAL_TABLET | Freq: Two times a day (BID) | ORAL | Status: DC
  Administered 2018-01-16 – 2018-01-19 (×7): 750 mg via ORAL
  Filled 2018-01-15 (×8): qty 1

## 2018-01-15 MED ORDER — IPRATROPIUM BROMIDE 0.02 % IN SOLN
0.5000 mg | Freq: Four times a day (QID) | RESPIRATORY_TRACT | Status: DC
  Administered 2018-01-15 (×2): 0.5 mg via RESPIRATORY_TRACT
  Filled 2018-01-15 (×3): qty 2.5

## 2018-01-15 MED ORDER — HYDRALAZINE HCL 20 MG/ML IJ SOLN: 5.0000 mg | INTRAMUSCULAR | Status: DC | PRN

## 2018-01-15 MED ORDER — POLYETHYLENE GLYCOL 3350 17 G PO PACK: 17.0000 g | PACK | Freq: Every day | ORAL | Status: DC | PRN

## 2018-01-15 MED ORDER — ASPIRIN 81 MG PO CHEW
81.0000 mg | CHEWABLE_TABLET | Freq: Every day | ORAL | Status: DC
  Administered 2018-01-16 – 2018-01-19 (×4): 81 mg via ORAL
  Filled 2018-01-15 (×4): qty 1

## 2018-01-15 MED ORDER — ACETAMINOPHEN 325 MG PO TABS
650.0000 mg | ORAL_TABLET | ORAL | Status: DC | PRN
  Administered 2018-01-15: 650 mg via ORAL
  Filled 2018-01-15: qty 2

## 2018-01-15 MED ORDER — DOCUSATE SODIUM 100 MG PO CAPS: 100.0000 mg | ORAL_CAPSULE | Freq: Every day | ORAL | Status: DC | PRN

## 2018-01-15 MED ORDER — SODIUM CHLORIDE 0.9 % IV SOLN
750.0000 mg | Freq: Once | INTRAVENOUS | Status: AC
  Administered 2018-01-15: 750 mg via INTRAVENOUS
  Filled 2018-01-15: qty 7.5

## 2018-01-15 MED ORDER — ENOXAPARIN SODIUM 40 MG/0.4ML ~~LOC~~ SOLN
40.0000 mg | SUBCUTANEOUS | Status: DC
  Administered 2018-01-15 – 2018-01-19 (×5): 40 mg via SUBCUTANEOUS
  Filled 2018-01-15 (×5): qty 0.4

## 2018-01-15 MED ORDER — METOPROLOL TARTRATE 25 MG PO TABS
25.0000 mg | ORAL_TABLET | Freq: Two times a day (BID) | ORAL | Status: DC
  Administered 2018-01-15: 25 mg via ORAL
  Filled 2018-01-15: qty 1

## 2018-01-15 MED ORDER — ATORVASTATIN CALCIUM 80 MG PO TABS: 80.0000 mg | ORAL_TABLET | Freq: Every evening | ORAL | Status: DC

## 2018-01-15 MED ORDER — SODIUM CHLORIDE 0.9 % IV SOLN
INTRAVENOUS | Status: DC
  Administered 2018-01-15 (×4): via INTRAVENOUS

## 2018-01-15 MED ORDER — LORAZEPAM 2 MG/ML IJ SOLN: 1.0000 mg | INTRAMUSCULAR | Status: DC | PRN

## 2018-01-15 MED ORDER — MAGNESIUM SULFATE 2 GM/50ML IV SOLN
2.0000 g | Freq: Once | INTRAVENOUS | Status: AC
  Administered 2018-01-15: 2 g via INTRAVENOUS
  Filled 2018-01-15: qty 50

## 2018-01-15 MED ORDER — PANTOPRAZOLE SODIUM 40 MG PO TBEC
40.0000 mg | DELAYED_RELEASE_TABLET | Freq: Two times a day (BID) | ORAL | Status: DC
  Administered 2018-01-15 – 2018-01-19 (×9): 40 mg via ORAL
  Filled 2018-01-15 (×9): qty 1

## 2018-01-15 MED ORDER — ZOLPIDEM TARTRATE 5 MG PO TABS: 5.0000 mg | ORAL_TABLET | Freq: Every day | ORAL | Status: DC

## 2018-01-15 MED ORDER — ISOSORBIDE MONONITRATE ER 30 MG PO TB24
15.0000 mg | ORAL_TABLET | Freq: Every day | ORAL | Status: DC
  Administered 2018-01-16 – 2018-01-18 (×3): 15 mg via ORAL
  Filled 2018-01-15 (×3): qty 1

## 2018-01-15 MED ORDER — IPRATROPIUM BROMIDE 0.02 % IN SOLN
0.5000 mg | Freq: Four times a day (QID) | RESPIRATORY_TRACT | Status: DC
  Administered 2018-01-15: 0.5 mg via RESPIRATORY_TRACT
  Filled 2018-01-15: qty 2.5

## 2018-01-15 MED ORDER — ORAL CARE MOUTH RINSE
15.0000 mL | Freq: Two times a day (BID) | OROMUCOSAL | Status: DC
  Administered 2018-01-15 – 2018-01-18 (×4): 15 mL via OROMUCOSAL

## 2018-01-15 MED ORDER — MAGNESIUM SULFATE IN D5W 1-5 GM/100ML-% IV SOLN
1.0000 g | Freq: Once | INTRAVENOUS | Status: AC
  Administered 2018-01-15: 1 g via INTRAVENOUS
  Filled 2018-01-15: qty 100

## 2018-01-15 MED ORDER — CYCLOBENZAPRINE HCL 10 MG PO TABS: 5.0000 mg | ORAL_TABLET | Freq: Three times a day (TID) | ORAL | Status: DC | PRN

## 2018-01-15 MED ORDER — NICOTINE 21 MG/24HR TD PT24: 21.0000 mg | MEDICATED_PATCH | Freq: Every day | TRANSDERMAL | Status: DC

## 2018-01-15 MED ORDER — IPRATROPIUM BROMIDE 0.02 % IN SOLN
0.5000 mg | RESPIRATORY_TRACT | Status: DC
  Administered 2018-01-15: 0.5 mg via RESPIRATORY_TRACT
  Filled 2018-01-15: qty 2.5

## 2018-01-15 MED ORDER — NITROGLYCERIN 0.4 MG SL SUBL: 0.4000 mg | SUBLINGUAL_TABLET | SUBLINGUAL | Status: DC | PRN

## 2018-01-15 MED ORDER — LEVALBUTEROL HCL 1.25 MG/0.5ML IN NEBU
1.2500 mg | INHALATION_SOLUTION | Freq: Four times a day (QID) | RESPIRATORY_TRACT | Status: DC
  Administered 2018-01-15 (×2): 1.25 mg via RESPIRATORY_TRACT
  Filled 2018-01-15 (×3): qty 0.5

## 2018-01-15 MED ORDER — AZITHROMYCIN 500 MG PO TABS: 250.0000 mg | ORAL_TABLET | Freq: Every day | ORAL | Status: DC

## 2018-01-15 MED ORDER — MORPHINE SULFATE (PF) 2 MG/ML IV SOLN: 2.0000 mg | INTRAVENOUS | Status: DC | PRN

## 2018-01-15 MED ORDER — GABAPENTIN 600 MG PO TABS
600.0000 mg | ORAL_TABLET | Freq: Two times a day (BID) | ORAL | Status: DC
  Administered 2018-01-15 – 2018-01-19 (×9): 600 mg via ORAL
  Filled 2018-01-15 (×10): qty 1

## 2018-01-15 MED ORDER — METHYLPREDNISOLONE SODIUM SUCC 125 MG IJ SOLR
60.0000 mg | Freq: Two times a day (BID) | INTRAMUSCULAR | Status: DC
  Administered 2018-01-15 – 2018-01-16 (×3): 60 mg via INTRAVENOUS
  Filled 2018-01-15 (×3): qty 2

## 2018-01-15 MED ORDER — TIOTROPIUM BROMIDE MONOHYDRATE 18 MCG IN CAPS: 18.0000 ug | ORAL_CAPSULE | Freq: Every day | RESPIRATORY_TRACT | Status: DC | PRN

## 2018-01-15 NOTE — Progress Notes (Addendum)
PROGRESS NOTE    Joan Newman  UJW:[id number] DOB: April 11, 1961 DOA: 01/14/2018 PCP: Lonie Peak, PA-C   Brief Narrative:  57 year old woman with a history of CAD status post LAD PCI, COPD, hypertension, seizure disorder, heavy tobacco abuse, presented on 01/14/2018 with 2-day history of worsening chest pain, following a recent presentation at [facility] and 2 other evaluations at the ED since September 14.  Both times, she was ruled out for MI, treated with steroids, for a component of COPD exacerbation.  In the ED, she was tachycardic,and hypotensive.  She also was mildly hypoxemic, requiring nasal cannula.  Initial labs, showed normal creatinine, bicarbonate of 38, white count 12.5, and a hematocrit of 55, with normal initial troponin.  EKG shows sinus tachycardia without acute ischemic changes.  She was noted to have 2 brief runs of NSVT (5-6 beats each ) on the monitor, followed by cardiology.  CT scan of the chest negative except for changes of COPD emphysema bibasilar scarring.  CT of the head was negative.  Overnight, she developed decreased level of consciousness, at which time she was on multiple narcotics and anxiolytics, and was noted to have respiratory acidosis, transferred to stepdown unit.  He was placed on full mechanical ventilatory support via mask, waking up, but minimally interactive.  She was consulted by critical care medicine, as she is a risk for intubation.  UDS is pending.  Current respiratory viral panel and blood cultures are pending.  She is on Zithromax.   Assessment & Plan:   Principal Problem:   Chest pain Active Problems:   COPD exacerbation (HCC)   Essential hypertension   Tobacco use disorder   CAD (coronary artery disease)   Hyperlipidemia   Anxiety   Depression   Seizure (HCC)   Stroke (HCC)   GERD (gastroesophageal reflux disease)   Chronic respiratory failure with hypoxia (HCC)   NSVT (nonsustained ventricular tachycardia) (HCC)   Sepsis (HCC)  Hypomagnesemia   Diabetes (HCC)   Pain dependent respiratory failure secondary to underlying COPD, continued tobacco abuse, as well as high-dose sedation.  She was hypercarbic.  CCM evaluation was obtained, as the patient developed respiratory acidosis, and she is at the risk of intubation.  CT of the chest negative for PE Appreciate pulmonary evaluation. Continue IV steroids Hold antibiotics at this time, as instructed by CCM Follow procalcitonin results Continue bronchodilators with Xopenex, as she had episodes of NSVT. Resume home nebulizer until she is more awake, and not tachycardic Repeat ABG every 4 hours  Chest pain, with 2 episodes of nonsustained ventricular tachycardia of 5 or 6 beats.  History of CAD  troponin negative.  Cardiology evaluation was obtained.  The likelihood of a primary arrhythmic event is low.  She received 2 g magnesium sulfate, troponins were cycled.  She received nitroglycerin, morphine and aspirin, Lipitor and Imdur .  2D echo pending cardiology follow-up appreciated  Telemetry Continue to cycle troponin Continue Imdur as indicated by CCM  Hypertension BP (!) 82/45   Pulse 76   Temp 97.9 F (36.6 C) (Oral)   Resp 15   Ht 5\' 2"  (1.575 m)   Wt 68 kg Comment:  c  SpO2 96%   BMI 27.42 kg/m .  Had an episode of low blood pressure. Hold home anti-hypertensive medications including lisinopril and metoprolol  Tobacco abuse Counseling was given on presentation. Continue nicotine patch  Hyperlipidemia, LDL 37, rest of the lipid profile is normal Continue Lipitor  New diagnosis Type II Diabetes Current blood  sugar level is 233, A1C 9.0 no prior diagnosis is noted Lab Results  Component Value Date   HGBA1C 9.0 (H) 01/15/2018  [facility] Diabetes coordinator for education, she will need to follow-up as an outpatient.  History of seizures, the patient is on 500 mg of Keppra at home.  Unclear if she did have an episode of seizure prior to admission.  CT of the  head was negative.  EDP discussed the case with neurology, Dr. Amada JupiterKirkpatrick, recommending to increase Keppra to 750 mg twice daily. Seizure precaution Continue Keppra 750 mg twice daily Ativan as needed for seizure  History of stroke, no acute issues.  CT of the head negative Continue aspirin and Lipitor  Depression and anxiety Continue home medications  GERD Continue Protonix  Hypomagnesemia, on admission mag was 1.5, with repleted. Continue to monitor   DVT prophylaxis: Lovenox Code Status: NPO for now   Family Communication:  NOne  Disposition Plan: We will continue to stepdown for now.  No plans for discharge at this time.  Consultants:   CCM, as the patient is at risk of needing intubation.  She is on noninvasive mechanical ventilatory support.  Cardiology, Dr. Allena Katzhakravartti  Diabetes coordinator  Procedures:  None  Antimicrobials:   Azithromycin IV   Subjective: At this time, patient arouses to voice, but unable to follow commands.  Unable to obtain any other review of systems.  Objective: Vitals:   01/15/18 1015 01/15/18 1030 01/15/18 1045 01/15/18 1100  BP: (!) 87/73 (!) 87/44 (!) 88/46 (!) 82/45  Pulse: 81 77 78 76  Resp: 13 17 15 15   Temp:      TempSrc:      SpO2: 96% 95% 96% 96%  Weight:      Height:        Intake/Output Summary (Last 24 hours) at 01/15/2018 1117 Last data filed at 01/15/2018 1100 Gross per 24 hour  Intake 2914.25 ml  Output 525 ml  Net 2389.25 ml   Filed Weights   01/14/18 2052 01/15/18 0133  Weight: 65.8 kg 68 kg    Examination:  General exam: Lethargic, arouses to strong verbal stimulus. Respiratory system: He is on ventilatory mask, fullface, no apparent rhonchi at this time or wheezing Cardiovascular system: S1 & S2 heard, RRR. No JVD, murmurs, rubs, gallops or clicks. No pedal edema. Gastrointestinal system: Abdomen is nondistended, obese soft and nontender. No organomegaly or masses felt. Normal bowel sounds  heard. Central nervous system: Arouses to voice, but unable to follow commands at this time. Extremities: Symmetric 5 x 5 power. Skin: No rashes, lesions or ulcers, hirsutism noted Psychiatry: Judgement and insight appear normal. Mood & affect appropriate.     Data Reviewed: I have personally reviewed following labs and imaging studies  CBC: Recent Labs  Lab 01/14/18 2004 01/14/18 2228 01/15/18 0039  WBC 12.5*  --  12.2*  HGB 17.9* 18.7* 16.2*  HCT 56.6* 55.0* 52.0*  MCV 96.8  --  97.7  PLT 318  --  254   Basic Metabolic Panel: Recent Labs  Lab 01/14/18 2004 01/14/18 2228 01/15/18 0039  NA 137 136  --   K 3.8 4.1  --   CL 87* 90*  --   CO2 33*  --   --   GLUCOSE 292* 233*  --   BUN 17 24*  --   CREATININE 0.66 0.50  --   CALCIUM 10.0  --   --   MG  --   --  1.5*  GFR: Estimated Creatinine Clearance: 71 mL/min (by C-G formula based on SCr of 0.5 mg/dL). Liver Function Tests: No results for input(s): AST, ALT, ALKPHOS, BILITOT, PROT, ALBUMIN in the last 168 hours. No results for input(s): LIPASE, AMYLASE in the last 168 hours. No results for input(s): AMMONIA in the last 168 hours. Coagulation Profile: No results for input(s): INR, PROTIME in the last 168 hours. Cardiac Enzymes: Recent Labs  Lab 01/15/18 0039 01/15/18 0701  TROPONINI <0.03 <0.03   BNP (last 3 results) No results for input(s): PROBNP in the last 8760 hours. HbA1C: Recent Labs    01/15/18 0701  HGBA1C 9.0*   CBG: Recent Labs  Lab 01/15/18 0459 01/15/18 0637 01/15/18 0732  GLUCAP 195* 169* 179*   Lipid Profile: Recent Labs    01/15/18 0701  CHOL 110  HDL 29*  LDLCALC 37  TRIG 161*  CHOLHDL 3.8   Thyroid Function Tests: Recent Labs    01/15/18 0701  TSH 2.920   Anemia Panel: No results for input(s): VITAMINB12, FOLATE, FERRITIN, TIBC, IRON, RETICCTPCT in the last 72 hours. Sepsis Labs: Recent Labs  Lab 01/14/18 2355 01/15/18 0713  LATICACIDVEN 2.45* 1.9     Recent Results (from the past 240 hour(s))  Blood culture (routine x 2)     Status: None (Preliminary result)   Collection Time: 01/14/18 11:25 PM  Result Value Ref Range Status   Specimen Description BLOOD LEFT ANTECUBITAL  Final   Special Requests   Final    BOTTLES DRAWN AEROBIC AND ANAEROBIC Blood Culture adequate volume   Culture   Final    NO GROWTH < 12 HOURS Performed at [facility], [address] [place], [place] [address]    Report Status PENDING  Incomplete  Blood culture (routine x 2)     Status: None (Preliminary result)   Collection Time: 01/14/18 11:33 PM  Result Value Ref Range Status   Specimen Description BLOOD RIGHT ANTECUBITAL  Final   Special Requests   Final    BOTTLES DRAWN AEROBIC AND ANAEROBIC Blood Culture adequate volume   Culture   Final    NO GROWTH < 12 HOURS Performed at [facility], [address] [place], [place] [address]    Report Status PENDING  Incomplete         Radiology Studies: Ct Head Wo Contrast  Result Date: 01/14/2018 CLINICAL DATA:  Chest pain radiating to the jaw and back. Altered level of consciousness. History of seizures. EXAM: CT HEAD WITHOUT CONTRAST TECHNIQUE: Contiguous axial images were obtained from the base of the skull through the vertex without intravenous contrast. COMPARISON:  04/08/2017 FINDINGS: Brain: No evidence of acute infarction, hemorrhage, hydrocephalus, extra-axial collection or mass lesion/mass effect. Vascular: Mild intracranial arterial vascular calcifications are present. Skull: Calvarium appears intact. Sinuses/Orbits: Paranasal sinuses and mastoid air cells are clear. Other: None. IMPRESSION: No acute intracranial abnormality. Electronically Signed   By: Burman Nieves M.D.   On: 01/14/2018 22:55   Ct Angio Chest Pe W And/or Wo Contrast  Result Date: 01/14/2018 CLINICAL DATA:  Chest pain radiating to the jaw on back. Previous history of myocardial infarct with similar symptoms.  EXAM: CT ANGIOGRAPHY CHEST WITH CONTRAST TECHNIQUE: Multidetector CT imaging of the chest was performed using the standard protocol during bolus administration of intravenous contrast. Multiplanar CT image reconstructions and MIPs were obtained to evaluate the vascular anatomy. CONTRAST:  59 mL Isovue 370 COMPARISON:  12/09/2015 FINDINGS: Cardiovascular: There is good opacification of the central  and segmental pulmonary arteries. No focal filling defects. No evidence of significant pulmonary embolus. Normal caliber thoracic aorta with minimal scattered calcifications. No aortic dissection. Normal heart size. No pericardial effusions. Mediastinum/Nodes: Esophagus is decompressed. No significant lymphadenopathy in the chest. Thyroid gland is unremarkable. Lungs/Pleura: Scarring in the lung bases is progressing since previous study. Small focal area of infiltration demonstrated in the anterior left lingula. This may indicate a focal area of pneumonia or developing scarring. Patchy emphysematous changes in the lungs. Peribronchial thickening suggesting chronic bronchitis. No pleural effusions. No pneumothorax. Airways are patent. Upper Abdomen: No acute abnormalities identified. Musculoskeletal: Superior endplate compression deformities at T11 and T12 with Schmorl's nodes. Appearance is chronic with degenerative changes present. There is progression since the previous study at T12. Review of the MIP images confirms the above findings. IMPRESSION: 1. No evidence of significant pulmonary embolus. 2. Scarring in the lung bases is progressing since previous study. Small focal area of infiltration in the anterior left lingula may indicate a focal area of pneumonia or developing scarring. 3. Patchy emphysematous changes in the lungs. 4. Superior endplate compression deformities at T11 and T12 with Schmorl's nodes, progressed since previous study but with nonacute appearance. Aortic Atherosclerosis (ICD10-I70.0) and Emphysema  (ICD10-J43.9). Electronically Signed   By: Burman Nieves M.D.   On: 01/14/2018 23:03   Dg Chest Port 1 View  Result Date: 01/14/2018 CLINICAL DATA:  Chest pain radiating to jaw and back. Similar symptoms with prior myocardial infarction. Smoker. EXAM: PORTABLE CHEST 1 VIEW COMPARISON:  Chest radiograph April 08, 2017 FINDINGS: Cardiomediastinal silhouette is normal. Mild calcific atherosclerosis aortic arch. LEFT upper lung zone granuloma. No pleural effusions or focal consolidations. Chronic interstitial changes. Bibasilar strandy densities. Trachea projects midline and there is no pneumothorax. Soft tissue planes and included osseous structures are non-suspicious. IMPRESSION: 1. Minimal bibasilar atelectasis/scarring. 2.  Aortic Atherosclerosis (ICD10-I70.0). 3.  Emphysema (ICD10-J43.9). Electronically Signed   By: Awilda Metro M.D.   On: 01/14/2018 21:00        Scheduled Meds: . aspirin  81 mg Oral Daily  . chlorhexidine  15 mL Mouth Rinse BID  . enoxaparin (LOVENOX) injection  40 mg Subcutaneous Q24H  . gabapentin  600 mg Oral BID  . insulin aspart  0-9 Units Subcutaneous Q4H  . ipratropium  0.5 mg Nebulization Q6H  . isosorbide mononitrate  15 mg Oral Daily  . levalbuterol  1.25 mg Nebulization Q6H  . [START ON 01/16/2018] levETIRAcetam  750 mg Oral BID  . mouth rinse  15 mL Mouth Rinse q12n4p  . methylPREDNISolone (SOLU-MEDROL) injection  60 mg Intravenous Q12H  . pantoprazole  40 mg Oral BID AC   Continuous Infusions: . sodium chloride 75 mL/hr at 01/15/18 1100  . sodium chloride       LOS: 0 days        Marlowe Kays, MD [facility] Pager [phone] or text via Haiku or Amion  If 7PM-7AM, please contact night-coverage [email or web address] Password [email or web address] 01/15/2018, 11:17 AM

## 2018-01-15 NOTE — Progress Notes (Signed)
Bodenheimer,NP paged and informed of pt.'s status and Nicki,RN RRT still with pt and talked with NP. Pt. waking up more, but goes right back to sleep..Marland Kitchen

## 2018-01-15 NOTE — Progress Notes (Signed)
Pt currently off BIPAP and resting comfortably. 89 HR, 97% on 2L NC[place] and RR 18. BIPAP on STBY at bedside. RT to cont to monitor.

## 2018-01-15 NOTE — Care Management Note (Signed)
Case Management Note  Patient Details  Name: Joan Newman MRN: [id number] Date of Birth: 1961/02/12  Subjective/Objective:    From home presents with copd, htn, cad, seizure, anxiety, depression, tobacco abuse.  Patient [facility] follow [facility] 11[facility]10.                 Action/Plan: NCM will follow for transition of care needs.   Expected Discharge Date:                  Expected Discharge Plan:     In-House Referral:     Discharge planning Services  CM Consult, Follow-up appt scheduled, [facility]  Post Acute Care Choice:    Choice offered to:     DME Arranged:    DME Agency:     HH Arranged:    HH Agency:     Status of Service:  In process, will continue to follow  If discussed at Long Length of Stay Meetings, dates discussed:    Additional Comments:  Leone Havenaylor, Kalyiah Saintil Clinton, RN 01/15/2018, 3:24 PM

## 2018-01-15 NOTE — Progress Notes (Signed)
  Echocardiogram 2D Echocardiogram has been performed.  Tonji Elliff G Marysol Wellnitz 01/15/2018, 4:01 PM

## 2018-01-15 NOTE — Progress Notes (Signed)
Pt. transported from ER via stretcher and walked to bed; alert and oriented x4. No c/o's chest pain. Poor hygiene with skin. Oriented pt. to room and call button.

## 2018-01-15 NOTE — Plan of Care (Signed)
  Problem: Education: Goal: Knowledge of General Education information will improve Description Including pain rating scale, medication(s)/side effects and non-pharmacologic comfort measures Outcome: Progressing Note:  POC and orders reviewed with pt.   

## 2018-01-15 NOTE — Progress Notes (Signed)
Placed patient on bipap due to arterial blood gas results. Rapid response at bedside.

## 2018-01-15 NOTE — Progress Notes (Signed)
NT Robin informed RN BP low from admission; pt. very lethargic and answering a few questions and unable to stay awake and very awake alert on admission at 0200. Checked cbg 195., manual bp 90/60 and dinamap 84/51. Rapid Response Nurse Nicki called. Asked pt. If she took any meds from home after I gave her meds at 0200 and she stated no.

## 2018-01-15 NOTE — Significant Event (Signed)
Rapid Response Event Note  Overview:  Called by charge nurse for decreased LOC stating pt extremely lethargic after receiving klonopin, seroquel, and neurontin. Bedside RN believed pt may have taken home meds at bedside.     Initial Focused Assessment: On arrival, pt opens eyes to voice briefly but is unable to answer questions. BP 85/46, HR 74, RR 16, spO2 94% on 2L [place].   Interventions: Narcan, 500ml bolus, Bodenheimer paged, ABG- 7.26/69/78/30, additional 500ml bolus ordered. Pt placed on bipap and orders received to transfer  Plan of Care (if not transferred): Pt transferred to 56M-10 as SDU level of care. Continue to monitor LOC and BP.  Event Summary:  Called at [phone]     Event ended at  0620        [facility] SystemNicole  Shahzad Thomann

## 2018-01-15 NOTE — Progress Notes (Signed)
Report called to Chris,RN for 35M-10- pt. transferred via bed by RN., Tim, resp. therapy on bipap and Nicki,RN RRT; pt. still lethargic and answering few questions.

## 2018-01-15 NOTE — Progress Notes (Signed)
CN Erica,RN ordered seizure pads from material mgt. and told none available.

## 2018-01-15 NOTE — Consult Note (Addendum)
PULMONARY / CRITICAL CARE MEDICINE   NAME:  Varnika Butz, MRN:  [id number], DOB:  Nov 16, 1960, LOS: 0 ADMISSION DATE:  01/14/2018,  CONSULTATION DATE: 01/15/2018 REFERRING MD: [facility],  CHIEF COMPLAINT: Hypercarbic respiratory failure  BRIEF HISTORY:    57 year old smoker admitted with chest pain found to have hypercarbic respiratory failure in the setting of multiple angiolytics and analgesics. HISTORY OF PRESENT ILLNESS   57 year old female who smokes 2 packs a day for life who has a known coronary artery disease chronic obstructive pulmonary disease and has had multiple admissions for chest pain.  She presented with chest pain 01/14/2018 not found to have nonsustained ventricular tachycardia 2 episodes of 5-6 beats and was admitted to [facility].  She was evaluated by cardiology had negative troponins at that time.  CT scan of the chest and head were essentially negative except for changes chest of COPD emphysema and bibasilar scarring.  During the night of 01/15/2018 she developed decreased level of consciousness, notes she was on multiple medications angiolytics and narcotics, and was transferred to the stepdown unit with a pH of 7.26 and a PCO2 of 62.  She is placed on full mechanical ventilatory support via mask and woke up and was minimally interactive.  [facility] critical care was asked to evaluate on 01/15/2018.  We will repeat an ABG continue her on noninvasive mechanical ventilatory support, we have also discontinued all her anxiolytics and analgesics until she is awake enough to interact properly.  Did have a CT of the head which was negative.  When she is awake and interactive we can slowly replace her angiolytics and analgesics as needed.  We will place her on sliding scale insulin for her hyperglycemia.  We will do a urine drug screen make sure there is nothing else going on.  There is no urgent need to intubate her at this time.  SIGNIFICANT PAST MEDICAL HISTORY    COPD Hypertension Coronary artery disease Seizure disorder Anxiety Depression Tobacco abuse   SIGNIFICANT EVENTS:  919 admitted to [facility] with chest pain 01/15/2018 transfer to stepdown unit with altered mental status and hypercarbic respiratory failure  STUDIES:   01/14/2018 CT of the chest negative for PE positive for emphysema and chronic scarring and is worse 01/14/2018 CT of the head with no acute abnormality  CULTURES:  01/14/2018 blood cultures x2>> 01/15/2018 respiratory viral panel>>  ANTIBIOTICS:  919 Zithromax>> 01/15/2018  LINES/TUBES:    CONSULTANTS:  01/15/2018 pulmonary critical care consult  SUBJECTIVE:  57 year old female is currently on noninvasive mechanical ventilatory support with adequate oxygenation will need a arterial blood gas to assess CO2 content.  CONSTITUTIONAL: BP (!) 90/45   Pulse 72   Temp 97.9 F (36.6 C) (Oral)   Resp 17   Ht 5\' 2"  (1.575 m)   Wt 68 kg Comment:  c  SpO2 93%   BMI 27.42 kg/m   I/O last 3 completed shifts: In: 2700 [I.V.:1150; IV Piggyback:1550] Out: -      FiO2 (%):  [40 %] 40 %  PHYSICAL EXAM: General: Disheveled female who arouses to strong verbal stimulus Neuro: When aroused follows commands moves all extremities aware of location, very quickly goes back to sleep when not stimulated. HEENT: Poor dentition, full facemask from noninvasive mechanical ventilatory support is in place Cardiovascular: Heart sounds regular regular rate and rhythm Lungs: Currently on noninvasive mechanical ventilatory support restraint rate of 16, tidal volume of approximately 450 cc and on 40% FiO2 with sats of 93%. Abdomen: Soft nontender  positive bowel sounds Musculoskeletal: Intact Skin: Lower extremities without edema warm dry  RESOLVED PROBLEM LIST   ASSESSMENT AND PLAN   Vent dependent respiratory failure secondary to underlying COPD, continued tobacco abuse coupled with high-dose  sedation. Hypercarbia COPD Continue tachycardia tobacco abuse -Continue noninvasive mechanical ventilatory support left foot is refractory and she will require intubation -Agree with steroid -Hold on antibiotics at this time -Check procalcitonin -Bronchodilators notes she is had episodes of nonsustained ventricular tachycardia therefore is on Xopenex 0.63.  Her home nebulizer pharmaceutical regimen is on hold until she is more awake -CT scan negative for pulmonary embolism  Chest pain 2 episodes of nonsustained ventricular tachycardia of 5-6 beat Coronary artery disease Hypertension is a history Hypertension on this admission -Continue Imdur at lower dose -Hold antihypertensives until blood pressure is adequate -Monitor on telemetry -Troponins negative -Cardiology consult has been completed and they are following -Continue to cycle troponin  History of seizure disorders with questionable seizure prior to this admission Anxiety disorder -Continue antiseizure medication -CT of the head 01/14/2018 was negative  Hyperglycemia with steroid exacerbation -Sliding scale insulin   SUMMARY OF TODAY'S PLAN:  Continue noninvasive mechanical dilatory support Repeat ABG within 4 hours Stop all sedatives and analgesics until awake Check procalcitonin May require intubation if unresponsive to current interventions.  Best Practice / Goals of Care / Disposition.   DVT PROPHYLAXIS: Low molecular weight heparin SUP: PPI NUTRITION: P.o. MOBILITY: Bedrest GOALS OF CARE: Full code FAMILY DISCUSSIONS: 01/15/2018 no family at bedside DISPOSITION currently stepdown unit on BiPAP on 01/15/2018  LABS  Glucose Recent Labs  Lab 01/15/18 0459 01/15/18 0637 01/15/18 0732  GLUCAP 195* 169* 179*    BMET Recent Labs  Lab 01/14/18 2004 01/14/18 2228  NA 137 136  K 3.8 4.1  CL 87* 90*  CO2 33*  --   BUN 17 24*  CREATININE 0.66 0.50  GLUCOSE 292* 233*    Liver Enzymes No results for  input(s): AST, ALT, ALKPHOS, BILITOT, ALBUMIN in the last 168 hours.  Electrolytes Recent Labs  Lab 01/14/18 2004 01/15/18 0039  CALCIUM 10.0  --   MG  --  1.5*    CBC Recent Labs  Lab 01/14/18 2004 01/14/18 2228 01/15/18 0039  WBC 12.5*  --  12.2*  HGB 17.9* 18.7* 16.2*  HCT 56.6* 55.0* 52.0*  PLT 318  --  254    ABG Recent Labs  Lab 01/15/18 0542  PHART 7.260*  PCO2ART 68.9*  PO2ART 78.0*    Coag's No results for input(s): APTT, INR in the last 168 hours.  Sepsis Markers Recent Labs  Lab 01/14/18 2355 01/15/18 0713  LATICACIDVEN 2.45* 1.9    Cardiac Enzymes Recent Labs  Lab 01/15/18 0039 01/15/18 0701  TROPONINI <0.03 <0.03    PAST MEDICAL HISTORY :   She  has a past medical history of Anxiety, Asthma, Coronary artery disease, Depression, Hypertension, Seizures (HCC), and Stroke (HCC).  PAST SURGICAL HISTORY:  She  has a past surgical history that includes Cesarean section.  No Known Allergies  No current facility-administered medications on file prior to encounter.    Current Outpatient Medications on File Prior to Encounter  Medication Sig  . acetaminophen (TYLENOL) 500 MG tablet Take 500-1,000 mg by mouth every 6 (six) hours as needed (for pain).  Marland Kitchen. albuterol (PROVENTIL HFA;VENTOLIN HFA) 108 (90 Base) MCG/ACT inhaler Inhale 2 puffs into the lungs every 4 (four) hours as needed for wheezing or shortness of breath.  Marland Kitchen. aspirin 81  MG chewable tablet Chew 81 mg by mouth daily.  Marland Kitchen atorvastatin (LIPITOR) 80 MG tablet Take 1 tablet (80 mg total) by mouth every evening.  Marland Kitchen buPROPion (WELLBUTRIN SR) 150 MG 12 hr tablet Take 150 mg by mouth every morning.   . celecoxib (CELEBREX) 100 MG capsule Take 100 mg by mouth 2 (two) times daily.  . clonazePAM (KLONOPIN) 1 MG tablet Take 1 mg by mouth 2 (two) times daily.   . cyclobenzaprine (FLEXERIL) 5 MG tablet Take 1 tablet (5 mg total) by mouth 3 (three) times daily as needed for muscle spasms.  Marland Kitchen docusate  sodium (COLACE) 100 MG capsule Take 100 mg by mouth daily as needed for mild constipation.  . Fluticasone-Salmeterol (ADVAIR DISKUS) 250-50 MCG/DOSE AEPB Inhale 2 puffs into the lungs 2 (two) times daily.  Marland Kitchen gabapentin (NEURONTIN) 600 MG tablet Take 600 mg by mouth 2 (two) times daily.  Marland Kitchen GLIPIZIDE PO Take 1 tablet by mouth daily before breakfast.  . isosorbide mononitrate (IMDUR) 30 MG 24 hr tablet Take 1 tablet (30 mg total) by mouth daily.  Marland Kitchen K-TAB 20 MEQ TBCR Take 3 tablets by mouth daily. (Patient taking differently: Take 60 mEq by mouth daily. )  . levETIRAcetam (KEPPRA) 500 MG tablet Take 1 tablet (500 mg total) by mouth 2 (two) times daily.  Marland Kitchen lisinopril (PRINIVIL,ZESTRIL) 20 MG tablet Take 1 tablet (20 mg total) by mouth 2 (two) times daily.  . metFORMIN (GLUCOPHAGE) 500 MG tablet Take 500 mg by mouth 2 (two) times daily.  . metoprolol tartrate (LOPRESSOR) 25 MG tablet Take 1 tablet (25 mg total) by mouth 2 (two) times daily.  . nitroGLYCERIN (NITROSTAT) 0.4 MG SL tablet Place 1 tablet (0.4 mg total) under the tongue every 5 (five) minutes as needed for chest pain.  . pantoprazole (PROTONIX) 40 MG tablet Take 1 tablet (40 mg total) by mouth 2 (two) times daily before a meal.  . polyethylene glycol (MIRALAX / GLYCOLAX) packet Take 17 g by mouth daily as needed for mild constipation.  Marland Kitchen PRESCRIPTION MEDICATION Prednisone taper pack (strength not verifiable): Take as directed  . QUEtiapine (SEROQUEL) 100 MG tablet Take 100 mg by mouth at bedtime.  Marland Kitchen tiotropium (SPIRIVA) 18 MCG inhalation capsule Place 18 mcg into inhaler and inhale daily as needed (for flares).  . zolpidem (AMBIEN) 10 MG tablet Take 1 tablet (10 mg total) by mouth at bedtime as needed for sleep. (Patient taking differently: Take 10 mg by mouth at bedtime. )  . HYDROcodone-acetaminophen (NORCO/VICODIN) 5-325 MG tablet Take 1 tablet by mouth every 6 (six) hours as needed. (Patient not taking: Reported on 01/14/2018)  .  tobramycin (TOBREX) 0.3 % ophthalmic ointment Place into both eyes 3 (three) times daily. (Patient not taking: Reported on 01/14/2018)  . traMADol (ULTRAM) 50 MG tablet Take 2 tablets (100 mg total) by mouth every 6 (six) hours as needed for moderate pain. (Patient not taking: Reported on 01/14/2018)    FAMILY HISTORY:   Her family history is not on file. She was adopted.  SOCIAL HISTORY:  She  reports that she has been smoking cigarettes. She has been smoking about 2.00 packs per day. She uses smokeless tobacco. She reports that she does not drink alcohol.  REVIEW OF SYSTEMS:    Not available secondary to altered mental status on 01/15/2018  [facility] Minor ACNP Adolph Pollack PCCM Pager [phone] till 1 pm If no answer page [phone] 01/15/2018, 9:26 AM  Attending Note:  57 year old  female with PMH of COPD who presents to PCCM with acute on chronic respiratory failure requiring BiPAP at this point.  On exam, decreased BS in all lung fields.  I reviewed chest CT myself, no acute disease noted.  Discussed with PCCM-NP.  Will d/c all sedation, patient is on narcotics, benzos and ambien.  BP is marginal as well, will d/c all anti-HTN.  No need for further ABGs at this point.  BiPAP for pulmonary support.  Titrate O2 for sat of 88-925%.  D/C abx.  Steroids and bronchodilators as ordered.  PCCM will continue to follow as consult.  The patient is critically ill with multiple organ systems failure and requires high complexity decision making for assessment and support, frequent evaluation and titration of therapies, application of advanced monitoring technologies and extensive interpretation of multiple databases.   Critical Care Time devoted to patient care services described in this note is  45  Minutes. This time reflects time of care of this signee Dr Koren Bound. This critical care time does not reflect procedure time, or teaching time or supervisory time of PA/NP/Med student/Med Resident etc but could  involve care discussion time.  Alyson Reedy, M.D. [facility] Pulmonary/Critical Care Medicine. Pager: [phone]. After hours pager: [phone].

## 2018-01-16 DIAGNOSIS — F17200 Nicotine dependence, unspecified, uncomplicated: Secondary | ICD-10-CM

## 2018-01-16 LAB — COMPREHENSIVE METABOLIC PANEL
ALBUMIN: 2.7 g/dL — AB (ref 3.5–5.0)
ALK PHOS: 59 U/L (ref 38–126)
ALT: 18 U/L (ref 0–44)
ANION GAP: 9 (ref 5–15)
AST: 16 U/L (ref 15–41)
BUN: 6 mg/dL (ref 6–20)
CO2: 27 mmol/L (ref 22–32)
Calcium: 8 mg/dL — ABNORMAL LOW (ref 8.9–10.3)
Chloride: 103 mmol/L (ref 98–111)
Creatinine, Ser: 0.44 mg/dL (ref 0.44–1.00)
GFR calc Af Amer: >60 mL/min (ref >60)
GFR calc non Af Amer: >60 mL/min (ref >60)
GLUCOSE: 197 mg/dL — AB (ref 70–99)
POTASSIUM: 4.7 mmol/L (ref 3.5–5.1)
SODIUM: 139 mmol/L (ref 135–145)
Total Bilirubin: 0.5 mg/dL (ref 0.3–1.2)
Total Protein: 5.4 g/dL — ABNORMAL LOW (ref 6.5–8.1)

## 2018-01-16 LAB — CBC
HCT: 44.7 % (ref 36.0–46.0)
HEMOGLOBIN: 14 g/dL (ref 12.0–15.0)
MCH: 30.8 pg (ref 26.0–34.0)
MCHC: 31.3 g/dL (ref 30.0–36.0)
MCV: 98.2 fL (ref 78.0–100.0)
Platelets: 205 10*3/uL (ref 150–400)
RBC: 4.55 MIL/uL (ref 3.87–5.11)
RDW: 13.8 % (ref 11.5–15.5)
WBC: 11.8 10*3/uL — AB (ref 4.0–10.5)

## 2018-01-16 LAB — GLUCOSE, CAPILLARY
GLUCOSE-CAPILLARY: 182 mg/dL — AB (ref 70–99)
GLUCOSE-CAPILLARY: 210 mg/dL — AB (ref 70–99)
GLUCOSE-CAPILLARY: 235 mg/dL — AB (ref 70–99)
Glucose-Capillary: 206 mg/dL — ABNORMAL HIGH (ref 70–99)
Glucose-Capillary: 202 mg/dL — ABNORMAL HIGH (ref 70–99)

## 2018-01-16 LAB — PROCALCITONIN

## 2018-01-16 MED ORDER — LEVALBUTEROL HCL 1.25 MG/0.5ML IN NEBU
1.2500 mg | INHALATION_SOLUTION | Freq: Three times a day (TID) | RESPIRATORY_TRACT | Status: DC
  Administered 2018-01-16 – 2018-01-18 (×8): 1.25 mg via RESPIRATORY_TRACT
  Filled 2018-01-16 (×8): qty 0.5

## 2018-01-16 MED ORDER — INSULIN ASPART 100 UNIT/ML ~~LOC~~ SOLN
0.0000 [IU] | Freq: Three times a day (TID) | SUBCUTANEOUS | Status: DC
  Administered 2018-01-17: 5 [IU] via SUBCUTANEOUS
  Administered 2018-01-17: 2 [IU] via SUBCUTANEOUS

## 2018-01-16 MED ORDER — NICOTINE 14 MG/24HR TD PT24
14.0000 mg | MEDICATED_PATCH | Freq: Every day | TRANSDERMAL | Status: DC
  Administered 2018-01-16 – 2018-01-19 (×4): 14 mg via TRANSDERMAL
  Filled 2018-01-16 (×4): qty 1

## 2018-01-16 MED ORDER — MELATONIN 3 MG PO TABS
3.0000 mg | ORAL_TABLET | Freq: Every evening | ORAL | Status: DC | PRN
  Administered 2018-01-16: 3 mg via ORAL
  Filled 2018-01-16 (×2): qty 1

## 2018-01-16 MED ORDER — HYDRALAZINE HCL 20 MG/ML IJ SOLN: 5.0000 mg | Freq: Four times a day (QID) | INTRAMUSCULAR | Status: DC | PRN

## 2018-01-16 MED ORDER — SENNOSIDES-DOCUSATE SODIUM 8.6-50 MG PO TABS
1.0000 | ORAL_TABLET | Freq: Two times a day (BID) | ORAL | Status: DC
  Administered 2018-01-16 – 2018-01-19 (×6): 1 via ORAL
  Filled 2018-01-16 (×7): qty 1

## 2018-01-16 MED ORDER — PREDNISONE 20 MG PO TABS
50.0000 mg | ORAL_TABLET | Freq: Every day | ORAL | Status: DC
  Administered 2018-01-17: 50 mg via ORAL
  Filled 2018-01-16: qty 2

## 2018-01-16 MED ORDER — CLONAZEPAM 0.5 MG PO TABS
0.5000 mg | ORAL_TABLET | Freq: Two times a day (BID) | ORAL | Status: DC
  Administered 2018-01-16 – 2018-01-19 (×7): 0.5 mg via ORAL
  Filled 2018-01-16 (×7): qty 1

## 2018-01-16 MED ORDER — IPRATROPIUM BROMIDE 0.02 % IN SOLN
0.5000 mg | Freq: Three times a day (TID) | RESPIRATORY_TRACT | Status: DC
  Administered 2018-01-16 – 2018-01-18 (×8): 0.5 mg via RESPIRATORY_TRACT
  Filled 2018-01-16 (×8): qty 2.5

## 2018-01-16 MED ORDER — INSULIN ASPART 100 UNIT/ML ~~LOC~~ SOLN
0.0000 [IU] | Freq: Every day | SUBCUTANEOUS | Status: DC
  Administered 2018-01-16: 2 [IU] via SUBCUTANEOUS

## 2018-01-16 MED ORDER — BISOPROLOL FUMARATE 5 MG PO TABS
5.0000 mg | ORAL_TABLET | Freq: Every day | ORAL | Status: DC
  Administered 2018-01-17 – 2018-01-19 (×3): 5 mg via ORAL
  Filled 2018-01-16 (×3): qty 1

## 2018-01-16 MED ORDER — POLYETHYLENE GLYCOL 3350 17 G PO PACK
17.0000 g | PACK | Freq: Every day | ORAL | Status: DC
  Administered 2018-01-16 – 2018-01-19 (×3): 17 g via ORAL
  Filled 2018-01-16 (×3): qty 1

## 2018-01-16 MED ORDER — QUETIAPINE FUMARATE 25 MG PO TABS
25.0000 mg | ORAL_TABLET | Freq: Every day | ORAL | Status: DC
  Administered 2018-01-16 – 2018-01-18 (×3): 25 mg via ORAL
  Filled 2018-01-16 (×3): qty 1

## 2018-01-16 MED ORDER — GUAIFENESIN ER 600 MG PO TB12
600.0000 mg | ORAL_TABLET | Freq: Two times a day (BID) | ORAL | Status: DC
  Administered 2018-01-16 – 2018-01-19 (×7): 600 mg via ORAL
  Filled 2018-01-16 (×7): qty 1

## 2018-01-16 NOTE — Progress Notes (Signed)
PROGRESS NOTE  Joan Newman WJX:[id number] DOB: 19-Apr-1961 DOA: 01/14/2018 PCP: Lonie Peak, PA-C  HPI/Recap of past 24 hours:  She is fully awake, she c/o not able to sleep last night,  she reports has been on clonopin, seroquel, ambien for the last 29yrs, she wants it to restarted   Assessment/Plan: Principal Problem:   Chest pain Active Problems:   COPD exacerbation (HCC)   Essential hypertension   Tobacco use disorder   CAD (coronary artery disease)   Hyperlipidemia   Anxiety   Depression   Seizure (HCC)   Stroke (HCC)   GERD (gastroesophageal reflux disease)   Chronic respiratory failure with hypoxia (HCC)   NSVT (nonsustained ventricular tachycardia) (HCC)   Sepsis (HCC)   Hypomagnesemia   Diabetes (HCC)   Acute hypoxic and hypercarbic respiratory failure/metabolic encephalopathy Due to COPD exacerbation and on high dose sedation CT chest no PE, chronic progressive scarring in lung and Peribronchial thickening suggesting chronic bronchitis.Patchy emphysematous changes in the lungs. Respiratory viral panel negative mrsa screening negative Blood culture negative procalcitonin less than 0.1 She improved on bipap, nebs, steroids At baseline she is not o2 dependent and lives alone, still drives Wean oxygen, ambulate  Brief hypotension (sbp dropped into 80's) Resolved, likely due to oversedation Respiratory viral panel negative mrsa screening negative Blood culture negative procalcitonin less than 0.1  Urine retention? Per RN foley placed in yesterday for urinary retention She is awake and alert, mobilize, voiding trial   Chest pain, likely due to elevated blood pressure and copd exacerbation Resolved Troponin negative, echo lvef wnl, no WMA. Does has grade II diastolic dysfunction  NSVT 2 episodes of 5-6beats on tele Cardiology consulted, think primary arrhythmia is less likely, cardiology think this is due to stress from copd exacerbation Start  cardioselective betablocker  HTN:  She was on metoprolol /lisinopril at home, this was held due to hypotension bp improving, start cardioselective betablocker tomorrow, titrate up at tolerated Continue imdur Plan to resume lisinopril tomorrow   H/o SEizure disorder -With h/o press syndrome /small SAH/seizures in 2017, she is started on keppra 500mg  bid since 2017 There is questionable seizure at home , she reports woke up with urinary incontinence and confusion, keppra dose increased to BID  noninsulin dependent DM2 Home oral meds held, on ssi here a1c 9 Blood glucose elevated while on steroids, taper steroids, adjust insulin  Tabacco use I have discussed tobacco cessation with the patient.  I have counseled the patient regarding the negative impacts of continued tobacco use including but not limited to lung cancer, COPD, and cardiovascular disease.  I have discussed alternatives to tobacco and modalities that may help facilitate tobacco cessation including but not limited to biofeedback, hypnosis, and medications.  Total time spent with tobacco counseling was 4 minutes.  nicotine patch provided  Chronic back pain:  Has been on neurotin/celebrex/flexeril neurontin continued,  celebrex and flexeril held  Depression/anxiety/insomina: tsh wnl She reports has been on clonopin/seroquel Remus Loffler /wellbutrinfor over 10years These was held on presentation due to encephalopathy/minimal responsive Now fully awake, will restart low dose clonopin and seroquel, will continue hold ambien I have advised patient to slowly taper this meds D/c home meds wellbutrin due to h/o seizure  Body mass index is 27.42 kg/m.   Code Status: full  Family Communication: patient   Disposition Plan: remain in stepdown, bipap qhs, wean oxygen   Consultants:  Critical care  Procedures:  bipap  Antibiotics:  none   Objective: BP 140/69  Pulse 95   Temp 98.8 F (37.1 C) (Oral)   Resp  16   Ht 5\' 2"  (1.575 m)   Wt 68 kg Comment:  c  SpO2 96%   BMI 27.42 kg/m   Intake/Output Summary (Last 24 hours) at 01/16/2018 1004 Last data filed at 01/16/2018 0800 Gross per 24 hour  Intake 1892.53 ml  Output 4420 ml  Net -2527.47 ml   Filed Weights   01/14/18 2052 01/15/18 0133  Weight: 65.8 kg 68 kg    Exam: Patient is examined daily including today on 01/16/2018, exams remain the same as of yesterday except that has changed    General:  NAD  Cardiovascular: slight sinus tachycardia  Respiratory: overall diminished, no wheezing  Abdomen: Soft/ND/NT, positive BS  Musculoskeletal: No Edema  Neuro: alert, oriented   Data Reviewed: Basic Metabolic Panel: Recent Labs  Lab 01/14/18 2004 01/14/18 2228 01/15/18 0039 01/15/18 1213 01/16/18 0402  NA 137 136  --  138 139  K 3.8 4.1  --  5.0 4.7  CL 87* 90*  --  104 103  CO2 33*  --   --  23 27  GLUCOSE 292* 233*  --  291* 197*  BUN 17 24*  --  13 6  CREATININE 0.66 0.50  --  0.52 0.44  CALCIUM 10.0  --   --  7.9* 8.0*  MG  --   --  1.5*  --   --   PHOS  --   --   --  2.9  --    Liver Function Tests: Recent Labs  Lab 01/15/18 1213 01/16/18 0402  AST  --  16  ALT  --  18  ALKPHOS  --  59  BILITOT  --  0.5  PROT  --  5.4*  ALBUMIN 2.8* 2.7*   No results for input(s): LIPASE, AMYLASE in the last 168 hours. No results for input(s): AMMONIA in the last 168 hours. CBC: Recent Labs  Lab 01/14/18 2004 01/14/18 2228 01/15/18 0039 01/16/18 0402  WBC 12.5*  --  12.2* 11.8*  HGB 17.9* 18.7* 16.2* 14.0  HCT 56.6* 55.0* 52.0* 44.7  MCV 96.8  --  97.7 98.2  PLT 318  --  254 205   Cardiac Enzymes:   Recent Labs  Lab 01/15/18 0039 01/15/18 0701 01/15/18 1213  TROPONINI <0.03 <0.03 <0.03   BNP (last 3 results) Recent Labs    01/14/18 2051  BNP 18.6    ProBNP (last 3 results) No results for input(s): PROBNP in the last 8760 hours.  CBG: Recent Labs  Lab 01/15/18 1619 01/15/18 2009  01/15/18 2322 01/16/18 0406 01/16/18 0822  GLUCAP 216* 166* 286* 182* 210*    Recent Results (from the past 240 hour(s))  Blood culture (routine x 2)     Status: None (Preliminary result)   Collection Time: 01/14/18 11:25 PM  Result Value Ref Range Status   Specimen Description BLOOD LEFT ANTECUBITAL  Final   Special Requests   Final    BOTTLES DRAWN AEROBIC AND ANAEROBIC Blood Culture adequate volume   Culture   Final    NO GROWTH 2 DAYS Performed at [facility], [address] [place], [place] [address]    Report Status PENDING  Incomplete  Blood culture (routine x 2)     Status: None (Preliminary result)   Collection Time: 01/14/18 11:33 PM  Result Value Ref Range Status   Specimen Description BLOOD RIGHT ANTECUBITAL  Final  Special Requests   Final    BOTTLES DRAWN AEROBIC AND ANAEROBIC Blood Culture adequate volume   Culture   Final    NO GROWTH 2 DAYS Performed at [facility], [address] [place], [place] [address]    Report Status PENDING  Incomplete  Respiratory Panel by PCR     Status: None   Collection Time: 01/15/18  9:53 AM  Result Value Ref Range Status   Adenovirus NOT DETECTED NOT DETECTED Final   Coronavirus 229E NOT DETECTED NOT DETECTED Final   Coronavirus HKU1 NOT DETECTED NOT DETECTED Final   Coronavirus NL63 NOT DETECTED NOT DETECTED Final   Coronavirus OC43 NOT DETECTED NOT DETECTED Final   Metapneumovirus NOT DETECTED NOT DETECTED Final   Rhinovirus / Enterovirus NOT DETECTED NOT DETECTED Final   Influenza A NOT DETECTED NOT DETECTED Final   Influenza B NOT DETECTED NOT DETECTED Final   Parainfluenza Virus 1 NOT DETECTED NOT DETECTED Final   Parainfluenza Virus 2 NOT DETECTED NOT DETECTED Final   Parainfluenza Virus 3 NOT DETECTED NOT DETECTED Final   Parainfluenza Virus 4 NOT DETECTED NOT DETECTED Final   Respiratory Syncytial Virus NOT DETECTED NOT DETECTED Final   Bordetella pertussis NOT DETECTED NOT DETECTED Final    Chlamydophila pneumoniae NOT DETECTED NOT DETECTED Final   Mycoplasma pneumoniae NOT DETECTED NOT DETECTED Final  MRSA PCR Screening     Status: None   Collection Time: 01/15/18  9:53 AM  Result Value Ref Range Status   MRSA by PCR NEGATIVE NEGATIVE Final    Comment:        The GeneXpert MRSA Assay (FDA approved for NASAL specimens only), is one component of a comprehensive MRSA colonization surveillance program. It is not intended to diagnose MRSA infection nor to guide or monitor treatment for MRSA infections. Performed at [facility], [address] [place], [place] [address]      Studies: No results found.  Scheduled Meds: . aspirin  81 mg Oral Daily  . chlorhexidine  15 mL Mouth Rinse BID  . clonazePAM  0.5 mg Oral BID  . enoxaparin (LOVENOX) injection  40 mg Subcutaneous Q24H  . gabapentin  600 mg Oral BID  . guaiFENesin  600 mg Oral BID  . insulin aspart  0-9 Units Subcutaneous Q4H  . ipratropium  0.5 mg Nebulization TID  . isosorbide mononitrate  15 mg Oral Daily  . levalbuterol  1.25 mg Nebulization TID  . levETIRAcetam  750 mg Oral BID  . mouth rinse  15 mL Mouth Rinse q12n4p  . nicotine  14 mg Transdermal Daily  . pantoprazole  40 mg Oral BID AC  . [START ON 01/17/2018] predniSONE  50 mg Oral Q breakfast  . QUEtiapine  25 mg Oral QHS  . senna-docusate  1 tablet Oral BID    Continuous Infusions: . sodium chloride       Time spent: 35mins I have personally reviewed and interpreted on  01/16/2018 daily labs, tele strips, imagings as discussed above under date review session and assessment and plans.  I reviewed all nursing notes, pharmacy notes, consultant notes,  vitals, pertinent old records  I have discussed plan of care as described above with RN , patient  on 01/16/2018   Albertine GratesFang Rakiya Krawczyk MD, PhD  [facility] Pager [phone]. If 7PM-7AM, please contact night-coverage at [email or web address], password [email or web address] 01/16/2018, 10:04 AM  LOS: 1 day

## 2018-01-16 NOTE — Progress Notes (Signed)
SATURATION QUALIFICATIONS: (This note is used to comply with regulatory documentation for home oxygen)  Patient Saturations on Room Air at Rest = 92%  Patient Saturations on Room Air while Ambulating = 87%  Patient Saturations on 1 Liters of oxygen while Ambulating = 94%  Please briefly explain why patient needs home oxygen:

## 2018-01-16 NOTE — Progress Notes (Addendum)
Inpatient Diabetes Program Recommendations  AACE/ADA: New Consensus Statement on Inpatient Glycemic Control (2019)  Target Ranges:  Prepandial:   less than 140 mg/dL      Peak postprandial:   less than 180 mg/dL (1-2 hours)      Critically ill patients:  140 - 180 mg/dL   Results for Joan Newman, Joan Newman (MRN [id number]) as of 01/16/2018 07:12  Ref. Range 01/15/2018 07:32 01/15/2018 12:09 01/15/2018 16:19 01/15/2018 20:09 01/15/2018 23:22 01/16/2018 04:06  Glucose-Capillary Latest Ref Range: 70 - 99 mg/dL 409179 (H) 811265 (H) 914216 (H) 166 (H) 286 (H) 182 (H)   Review of Glycemic Control  Diabetes history: DM2 (dx in April 2018) Outpatient Diabetes medications: Metformin 500 mg BID, Glipizide daily (pt not sure of dose) Current orders for Inpatient glycemic control: Novolog 0-9 units Q4H; Solumedrol 60 mg Q12H  Inpatient Diabetes Program Recommendations:   Insulin-Basal: If Solumedrol is continued as ordered, please consider ordering Lantus 10 units Q24H. Please note that if Lantus is ordered, it will likely need to be adjusted as steroids are tapered.  HgbA1C: A1C 9% on 01/15/18 indicating an average glucose of 212 mg/dl over the past 2-3 months. Patient does have prior dx of DM and taking oral DM medications as an outpatient.  Anticipate elevated A1C due to recent [facility] with [facility] while inpatient and discharged on Prednisone taper which patient was still taking when presented to hospital on 01/14/18. Patient will need to follow up with PCP regarding DM control.  NOTE: Noted consult for new DM dx. Chart reviewed. Noted in Care Everywhere A1C 8.2% on 01/11/18, 7.7% on 08/05/16, and 7% on 06/02/16.  [facility], patient was dx with DM at that time and was to follow up with PCP regarding DM medications. Patient has Glipizide and Metformin listed on home medication list. Also noted, [facility] and received [facility] and was discharged on Prednisone  taper which patient was still taking at time of presentation to hospital on 01/14/18.  Therefore, elevated A1C likely due to recent steroid use over the past 1-2 weeks and use of IV steroids while inpatient.   Noted consult for Diabetes Coordinator. Diabetes Coordinator is not on campus over the weekend but available by pager from 8am to 5pm for questions or concerns. Spoke with patient over the phone and she confirms prior DM2 hx and using Glipizide and Metformin at home for DM control. Patient reports that prior to using the steroids at home her glucose was usually 110-140 mg/dl. However, it has been higher with recent steroid use. Discussed current A1C 9% on 01/15/18 and explained that elevated A1C most likely due to recent [facility] and as an outpatient.  Explained that currently patient is ordered Solumedrol IV which is contributing to hyperglycemia as an inpatient. Patient verbalized understanding of information discussed and she reports she has no further questions at this time.  Thanks, Orlando PennerMarie Hailea Eaglin, RN, MSN, CDE Diabetes Coordinator Inpatient Diabetes Program [phone] (Team Pager from 8am to 5pm)

## 2018-01-16 NOTE — Progress Notes (Signed)
Patient refusing BiPAP at this time.

## 2018-01-16 NOTE — Progress Notes (Signed)
O2 Sat 87 percent on room air while ambulating.Denies chest pAin or SOB. Placed on 1 liter nasal cannula,o2 sat up to 94 percent.

## 2018-01-16 NOTE — Plan of Care (Signed)
Nutrition Education Note   RD consulted for nutrition education regarding diabetes.   Lab Results  Component Value Date   HGBA1C 9.0 (H) 01/15/2018   Met with pt in room today. Pt reports good appetite and oral intake; eating 100% of meals. Pt reports that her brother has type I DM and that she has been helping take care of him. By doing this, pt reports that she has learned some about diabetes. Pt has been intentionally trying to loose weight and has brought her AIC down some. Educated pt today on the differences between Type I and Type II Dm.   RD provided "Nutrition and Type II Diabetes" handout from the Academy of [employer] Dietetics. Discussed different food groups and their effects on blood sugar, emphasizing carbohydrate-containing foods. Provided list of carbohydrates and recommended serving sizes of common foods.  Discussed importance of controlled and consistent carbohydrate intake throughout the day. Provided examples of ways to balance meals/snacks and encouraged intake of high-fiber, whole grain complex carbohydrates. Teach back method used.  Expect good compliance.  Body mass index is 27.42 kg/m. Pt meets criteria for overweight based on current BMI.  Current diet order is carbohydrate controlled, patient is consuming approximately 100% of meals at this time. Labs and medications reviewed. No further nutrition interventions warranted at this time. RD contact information provided. If additional nutrition issues arise, please re-consult RD.  Koleen Distance MS, RD, LDN Pager #- [phone] Office#- [phone] After Hours Pager: [phone]

## 2018-01-16 NOTE — Progress Notes (Signed)
Called report and pt transferred to 6 East bed 1 per wheelchair by [employer]

## 2018-01-17 LAB — CBC
HEMATOCRIT: 45 % (ref 36.0–46.0)
Hemoglobin: 14.1 g/dL (ref 12.0–15.0)
MCH: 30.5 pg (ref 26.0–34.0)
MCHC: 31.3 g/dL (ref 30.0–36.0)
MCV: 97.2 fL (ref 78.0–100.0)
Platelets: 212 10*3/uL (ref 150–400)
RBC: 4.63 MIL/uL (ref 3.87–5.11)
RDW: 13.9 % (ref 11.5–15.5)
WBC: 8.8 10*3/uL (ref 4.0–10.5)

## 2018-01-17 LAB — BASIC METABOLIC PANEL
Anion gap: 11 (ref 5–15)
BUN: 10 mg/dL (ref 6–20)
CO2: 31 mmol/L (ref 22–32)
Calcium: 8.5 mg/dL — ABNORMAL LOW (ref 8.9–10.3)
Chloride: 98 mmol/L (ref 98–111)
Creatinine, Ser: 0.64 mg/dL (ref 0.44–1.00)
GFR calc Af Amer: >60 mL/min (ref >60)
GLUCOSE: 210 mg/dL — AB (ref 70–99)
Potassium: 3.7 mmol/L (ref 3.5–5.1)
Sodium: 140 mmol/L (ref 135–145)

## 2018-01-17 LAB — GLUCOSE, CAPILLARY
GLUCOSE-CAPILLARY: 185 mg/dL — AB (ref 70–99)
GLUCOSE-CAPILLARY: 438 mg/dL — AB (ref 70–99)
GLUCOSE-CAPILLARY: 379 mg/dL — AB (ref 70–99)
GLUCOSE-CAPILLARY: 218 mg/dL — AB (ref 70–99)
Glucose-Capillary: 282 mg/dL — ABNORMAL HIGH (ref 70–99)
Glucose-Capillary: 160 mg/dL — ABNORMAL HIGH (ref 70–99)

## 2018-01-17 LAB — MAGNESIUM: Magnesium: 1.6 mg/dL — ABNORMAL LOW (ref 1.7–2.4)

## 2018-01-17 LAB — PROCALCITONIN: Procalcitonin: <0.1 ng/mL

## 2018-01-17 MED ORDER — SODIUM CHLORIDE 0.9% FLUSH
3.0000 mL | Freq: Two times a day (BID) | INTRAVENOUS | Status: DC
  Administered 2018-01-17 – 2018-01-19 (×4): 3 mL via INTRAVENOUS

## 2018-01-17 MED ORDER — FUROSEMIDE 10 MG/ML IJ SOLN
40.0000 mg | Freq: Once | INTRAMUSCULAR | Status: AC
  Administered 2018-01-17: 40 mg via INTRAVENOUS
  Filled 2018-01-17: qty 4

## 2018-01-17 MED ORDER — MAGNESIUM SULFATE 2 GM/50ML IV SOLN
2.0000 g | Freq: Once | INTRAVENOUS | Status: AC
  Administered 2018-01-17: 2 g via INTRAVENOUS
  Filled 2018-01-17: qty 50

## 2018-01-17 MED ORDER — MELATONIN 3 MG PO TABS
3.0000 mg | ORAL_TABLET | Freq: Every day | ORAL | Status: DC
  Administered 2018-01-17 – 2018-01-18 (×2): 3 mg via ORAL
  Filled 2018-01-17 (×3): qty 1

## 2018-01-17 MED ORDER — PREDNISONE 20 MG PO TABS
40.0000 mg | ORAL_TABLET | Freq: Every day | ORAL | Status: DC
  Administered 2018-01-18: 40 mg via ORAL
  Filled 2018-01-17: qty 2

## 2018-01-17 MED ORDER — INSULIN ASPART 100 UNIT/ML ~~LOC~~ SOLN
0.0000 [IU] | Freq: Three times a day (TID) | SUBCUTANEOUS | Status: DC
  Administered 2018-01-17: 20 [IU] via SUBCUTANEOUS
  Administered 2018-01-18: 4 [IU] via SUBCUTANEOUS
  Administered 2018-01-18: 7 [IU] via SUBCUTANEOUS
  Administered 2018-01-18: 15 [IU] via SUBCUTANEOUS
  Administered 2018-01-19 (×2): 4 [IU] via SUBCUTANEOUS
  Administered 2018-01-19: 11 [IU] via SUBCUTANEOUS

## 2018-01-17 MED ORDER — INSULIN ASPART 100 UNIT/ML ~~LOC~~ SOLN: 0.0000 [IU] | Freq: Every day | SUBCUTANEOUS | Status: DC

## 2018-01-17 MED ORDER — SODIUM CHLORIDE 0.9% FLUSH: 3.0000 mL | INTRAVENOUS | Status: DC | PRN

## 2018-01-17 MED ORDER — MAGNESIUM CITRATE PO SOLN: 1.0000 | Freq: Once | ORAL | Status: DC

## 2018-01-17 MED ORDER — INSULIN ASPART 100 UNIT/ML ~~LOC~~ SOLN
4.0000 [IU] | Freq: Three times a day (TID) | SUBCUTANEOUS | Status: DC
  Administered 2018-01-17 – 2018-01-19 (×7): 4 [IU] via SUBCUTANEOUS

## 2018-01-17 MED ORDER — POTASSIUM CHLORIDE CRYS ER 20 MEQ PO TBCR
20.0000 meq | EXTENDED_RELEASE_TABLET | Freq: Once | ORAL | Status: AC
  Administered 2018-01-17: 20 meq via ORAL
  Filled 2018-01-17: qty 1

## 2018-01-17 NOTE — Evaluation (Addendum)
Physical Therapy Evaluation Patient Details Name: Joan Newman MRN: [id number] DOB: 04-Aug-1960 Today's Date: 01/17/2018   History of Present Illness  Pt adm with chest pain and copd exacerbation. Developed acute hypoxic respiratory failure and encephalopathy likely due to sedation. PMH - copd,htn, anxiety, cad,   Clinical Impression  Pt doing well with mobility and no further PT needed.  Ready for dc from PT standpoint. See separate note for O2 requirements. Instructed pt to amb in halls 2-3x/day on her own or with staff member.      Follow Up Recommendations No PT follow up    Equipment Recommendations  None recommended by PT    Recommendations for Other Services       Precautions / Restrictions Precautions Precautions: None      Mobility  Bed Mobility               General bed mobility comments: Pt up in chair  Transfers Overall transfer level: Independent Equipment used: None                Ambulation/Gait Ambulation/Gait assistance: Independent   Assistive device: None Gait Pattern/deviations: WFL(Within Functional Limits)   Gait velocity interpretation: >4.37 ft/sec, indicative of normal walking speed General Gait Details: Steady gait. Amb on RA initially with SpO2 to 85%. Completed amb with 1L of O2 with SpO2 92%.  Stairs            Wheelchair Mobility    Modified Rankin (Stroke Patients Only)       Balance Overall balance assessment: No apparent balance deficits (not formally assessed)                                           Pertinent Vitals/Pain Pain Assessment: No/denies pain    Home Living Family/patient expects to be discharged to:: Private residence Living Arrangements: Alone Available Help at Discharge: Family;Available PRN/intermittently Type of Home: Mobile home Home Access: Stairs to enter Entrance Stairs-Rails: Right Entrance Stairs-Number of Steps: 4 Home Layout: One level Home Equipment:  None      Prior Function Level of Independence: Independent               Hand Dominance   Dominant Hand: Right    Extremity/Trunk Assessment   Upper Extremity Assessment Upper Extremity Assessment: Overall WFL for tasks assessed    Lower Extremity Assessment Lower Extremity Assessment: Overall WFL for tasks assessed    Cervical / Trunk Assessment Cervical / Trunk Assessment: Normal  Communication   Communication: No difficulties  Cognition Arousal/Alertness: Awake/alert Behavior During Therapy: WFL for tasks assessed/performed Overall Cognitive Status: Within Functional Limits for tasks assessed                                        General Comments      Exercises     Assessment/Plan    PT Assessment Patent does not need any further PT services  PT Problem List         PT Treatment Interventions      PT Goals (Current goals can be found in the Care Plan section)  Acute Rehab PT Goals PT Goal Formulation: All assessment and education complete, DC therapy    Frequency     Barriers to discharge  Co-evaluation               AM-PAC PT "6 Clicks" Daily Activity  Outcome Measure Difficulty turning over in bed (including adjusting bedclothes, sheets and blankets)?: None Difficulty moving from lying on back to sitting on the side of the bed? : None Difficulty sitting down on and standing up from a chair with arms (e.g., wheelchair, bedside commode, etc,.)?: None Help needed moving to and from a bed to chair (including a wheelchair)?: None Help needed walking in hospital room?: None Help needed climbing 3-5 steps with a railing? : None 6 Click Score: 24    End of Session Equipment Utilized During Treatment: Oxygen Activity Tolerance: Patient tolerated treatment well Patient left: in chair;with call bell/phone within reach Nurse Communication: Mobility status PT Visit Diagnosis: Other abnormalities of gait and mobility  (R26.89)    Time: 1559-1610 PT Time Calculation (min) (ACUTE ONLY): 11 min   Charges:   PT Evaluation $PT Eval Low Complexity: 1 Low          [facility] PT Acute Rehabilitation Services Pager [phone] Office [phone]   Angelina Ok [facility] 01/17/2018, 4:22 PM

## 2018-01-17 NOTE — Progress Notes (Signed)
SATURATION QUALIFICATIONS: (This note is used to comply with regulatory documentation for home oxygen)  Patient Saturations on Room Air at Rest = 90%  Patient Saturations on Room Air while Ambulating = 85%  Patient Saturations on 1 Liters of oxygen while Ambulating = 92%  Please briefly explain why patient needs home oxygen:Decreased oxygen levels with activity without supplemental O2.  Georga [facility]ackingary [facility] PT Acute Rehabilitation Services Pager [phone] Office [phone]

## 2018-01-17 NOTE — Progress Notes (Addendum)
PROGRESS NOTE  Joan Newman ZOX:[id number] DOB: 09/21/1960 DOA: 01/14/2018 PCP: Lonie Peakonroy, Nathan, PA-C  HPI/Recap of past 24 hours:  Reports feeling nausea, denies ab pain, passing gas and having bm. RN reports she eat 100% of the meal  She is fully awake, she is breathing better, still cough , but not able to cough up  she remain oxygen dependent, she declined bipap last night due to claustrophobic  She c/o being swollen, reports weight gain  Assessment/Plan: Principal Problem:   Chest pain Active Problems:   COPD exacerbation (HCC)   Essential hypertension   Tobacco use disorder   CAD (coronary artery disease)   Hyperlipidemia   Anxiety   Depression   Seizure (HCC)   Stroke (HCC)   GERD (gastroesophageal reflux disease)   Chronic respiratory failure with hypoxia (HCC)   NSVT (nonsustained ventricular tachycardia) (HCC)   Sepsis (HCC)   Hypomagnesemia   Diabetes (HCC)  Acute hypoxic and hypercarbic respiratory failure/metabolic encephalopathy -Due to COPD exacerbation and on high dose sedation -CT chest no PE, chronic progressive scarring in lung and Peribronchial thickening suggesting chronic bronchitis.Patchy emphysematous changes in the lungs. -Respiratory viral panel negative -mrsa screening negative -Blood culture negative -procalcitonin less than 0.1 -She improved on bipap, nebs, steroids -At baseline she is not o2 dependent and lives alone, still drives -Wean oxygen, ambulate  Acute on chronic diastolic chf exacerbation: -she remains oxygen dependent, she reports weight gain and being swollen -lasix 40mg  x1 today, reevaluate in am -she may need to d/c home on prn lasix  Brief hypotension (sbp dropped into 80's) Resolved, likely due to oversedation Respiratory viral panel negative mrsa screening negative Blood culture negative procalcitonin less than 0.1  Urine retention? Per RN foley placed on 9/20 for urinary retention She is awake and alert,  mobilize,  She urinated spontaneously after foley removal    Chest pain ( on presentation),  likely due to elevated blood pressure and COPD exacerbation Resolved Troponin negative, echo lvef wnl, no WMA. Does has grade II diastolic dysfunction  NSVT 2 episodes of 5-6beats on tele Cardiology consulted, think primary arrhythmia is less likely, cardiology think this is due to stress from copd exacerbation Start cardioselective betablocker bisoprolol  Hypomagnesemia:  Replace mag, repeat in am  HTN:  She was on metoprolol /lisinopril at home, this was held due to hypotension bp improving, start cardioselective betablocker bisoprolol, titrate up at tolerated Continue home meds imdur,  May need to start on lower dose lisinopril tomorrow, she is getting lasix today   H/o SEizure disorder -With h/o press syndrome /small SAH/seizures in 2017, she is started on keppra 500mg  bid since 2017 There is questionable seizure at home , she reports woke up with urinary incontinence and confusion, keppra dose increased to 759mh BID  noninsulin dependent DM2 Home oral meds held, on ssi here a1c 9 Blood glucose elevated while on steroids, taper steroids, adjust insulin  Tabacco use I have discussed tobacco cessation with the patient.    nicotine patch provided  Chronic back pain:  Has been on neurotin/celebrex/flexeril neurontin continued,  celebrex and flexeril held  Depression/anxiety/insomina: tsh wnl She reports has been on clonopin/seroquel Remus Loffler/ambien /wellbutrinfor over 10years These was held on presentation due to encephalopathy/minimal responsive Now fully awake, will restart low dose clonopin and seroquel, will continue hold ambien I have advised patient to slowly taper this meds D/c home meds wellbutrin due to h/o seizure  Body mass index is 28.04 kg/m.   Code Status: full  Family  Communication: patient   Disposition Plan: remain in stepdown, bipap qhs, wean  oxygen   Consultants:  Critical care  Procedures:  bipap  Antibiotics:  none   Objective: BP (!) 143/80 (BP Location: Right Arm)   Pulse 94   Temp 98.5 F (36.9 C) (Oral)   Resp 18   Ht 5\' 2"  (1.575 m)   Wt 69.5 kg   SpO2 91%   BMI 28.04 kg/m   Intake/Output Summary (Last 24 hours) at 01/17/2018 1007 Last data filed at 01/16/2018 2230 Gross per 24 hour  Intake 1442 ml  Output 275 ml  Net 1167 ml   Filed Weights   01/14/18 2052 01/15/18 0133 01/17/18 0335  Weight: 65.8 kg 68 kg 69.5 kg    Exam: Patient is examined daily including today on 01/17/2018, exams remain the same as of yesterday except that has changed    General:  NAD  Cardiovascular: nsr  Respiratory: improved aeration, no wheezing. No rales  Abdomen: Soft/ND/NT, positive BS  Musculoskeletal: No Edema  Neuro: alert, oriented   Data Reviewed: Basic Metabolic Panel: Recent Labs  Lab 01/14/18 2004 01/14/18 2228 01/15/18 0039 01/15/18 1213 01/16/18 0402 01/17/18 0400  NA 137 136  --  138 139 140  K 3.8 4.1  --  5.0 4.7 3.7  CL 87* 90*  --  104 103 98  CO2 33*  --   --  23 27 31   GLUCOSE 292* 233*  --  291* 197* 210*  BUN 17 24*  --  13 6 10   CREATININE 0.66 0.50  --  0.52 0.44 0.64  CALCIUM 10.0  --   --  7.9* 8.0* 8.5*  MG  --   --  1.5*  --   --  1.6*  PHOS  --   --   --  2.9  --   --    Liver Function Tests: Recent Labs  Lab 01/15/18 1213 01/16/18 0402  AST  --  16  ALT  --  18  ALKPHOS  --  59  BILITOT  --  0.5  PROT  --  5.4*  ALBUMIN 2.8* 2.7*   No results for input(s): LIPASE, AMYLASE in the last 168 hours. No results for input(s): AMMONIA in the last 168 hours. CBC: Recent Labs  Lab 01/14/18 2004 01/14/18 2228 01/15/18 0039 01/16/18 0402 01/17/18 0400  WBC 12.5*  --  12.2* 11.8* 8.8  HGB 17.9* 18.7* 16.2* 14.0 14.1  HCT 56.6* 55.0* 52.0* 44.7 45.0  MCV 96.8  --  97.7 98.2 97.2  PLT 318  --  254 205 212   Cardiac Enzymes:   Recent Labs  Lab  01/15/18 0039 01/15/18 0701 01/15/18 1213  TROPONINI <0.03 <0.03 <0.03   BNP (last 3 results) Recent Labs    01/14/18 2051  BNP 18.6    ProBNP (last 3 results) No results for input(s): PROBNP in the last 8760 hours.  CBG: Recent Labs  Lab 01/16/18 0822 01/16/18 1227 01/16/18 1612 01/16/18 2112 01/17/18 0759  GLUCAP 210* 235* 206* 202* 185*    Recent Results (from the past 240 hour(s))  Blood culture (routine x 2)     Status: None (Preliminary result)   Collection Time: 01/14/18 11:25 PM  Result Value Ref Range Status   Specimen Description BLOOD LEFT ANTECUBITAL  Final   Special Requests   Final    BOTTLES DRAWN AEROBIC AND ANAEROBIC Blood Culture adequate volume   Culture   Final    NO  GROWTH 3 DAYS Performed at [facility], [address] [place], [place] [address]    Report Status PENDING  Incomplete  Blood culture (routine x 2)     Status: None (Preliminary result)   Collection Time: 01/14/18 11:33 PM  Result Value Ref Range Status   Specimen Description BLOOD RIGHT ANTECUBITAL  Final   Special Requests   Final    BOTTLES DRAWN AEROBIC AND ANAEROBIC Blood Culture adequate volume   Culture   Final    NO GROWTH 3 DAYS Performed at [facility], [address] [place], [place] [address]    Report Status PENDING  Incomplete  Respiratory Panel by PCR     Status: None   Collection Time: 01/15/18  9:53 AM  Result Value Ref Range Status   Adenovirus NOT DETECTED NOT DETECTED Final   Coronavirus 229E NOT DETECTED NOT DETECTED Final   Coronavirus HKU1 NOT DETECTED NOT DETECTED Final   Coronavirus NL63 NOT DETECTED NOT DETECTED Final   Coronavirus OC43 NOT DETECTED NOT DETECTED Final   Metapneumovirus NOT DETECTED NOT DETECTED Final   Rhinovirus / Enterovirus NOT DETECTED NOT DETECTED Final   Influenza A NOT DETECTED NOT DETECTED Final   Influenza B NOT DETECTED NOT DETECTED Final   Parainfluenza Virus 1 NOT DETECTED NOT DETECTED Final    Parainfluenza Virus 2 NOT DETECTED NOT DETECTED Final   Parainfluenza Virus 3 NOT DETECTED NOT DETECTED Final   Parainfluenza Virus 4 NOT DETECTED NOT DETECTED Final   Respiratory Syncytial Virus NOT DETECTED NOT DETECTED Final   Bordetella pertussis NOT DETECTED NOT DETECTED Final   Chlamydophila pneumoniae NOT DETECTED NOT DETECTED Final   Mycoplasma pneumoniae NOT DETECTED NOT DETECTED Final  MRSA PCR Screening     Status: None   Collection Time: 01/15/18  9:53 AM  Result Value Ref Range Status   MRSA by PCR NEGATIVE NEGATIVE Final    Comment:        The GeneXpert MRSA Assay (FDA approved for NASAL specimens only), is one component of a comprehensive MRSA colonization surveillance program. It is not intended to diagnose MRSA infection nor to guide or monitor treatment for MRSA infections. Performed at [facility], [address] [place], [place] [address]      Studies: No results found.  Scheduled Meds: . aspirin  81 mg Oral Daily  . bisoprolol  5 mg Oral Daily  . chlorhexidine  15 mL Mouth Rinse BID  . clonazePAM  0.5 mg Oral BID  . enoxaparin (LOVENOX) injection  40 mg Subcutaneous Q24H  . furosemide  40 mg Intravenous Once  . gabapentin  600 mg Oral BID  . guaiFENesin  600 mg Oral BID  . insulin aspart  0-5 Units Subcutaneous QHS  . insulin aspart  0-9 Units Subcutaneous TID WC  . ipratropium  0.5 mg Nebulization TID  . isosorbide mononitrate  15 mg Oral Daily  . levalbuterol  1.25 mg Nebulization TID  . levETIRAcetam  750 mg Oral BID  . mouth rinse  15 mL Mouth Rinse q12n4p  . nicotine  14 mg Transdermal Daily  . pantoprazole  40 mg Oral BID AC  . polyethylene glycol  17 g Oral Daily  . predniSONE  50 mg Oral Q breakfast  . QUEtiapine  25 mg Oral QHS  . senna-docusate  1 tablet Oral BID    Continuous Infusions: . magnesium sulfate 1 - 4 g bolus IVPB 2 g (01/17/18 0914)  .  sodium chloride       Time spent: I have personally reviewed and  interpreted on  01/17/2018 daily labs, tele strips, imagings as discussed above under date review session and assessment and plans.  I reviewed all nursing notes, pharmacy notes, consultant notes,  vitals, pertinent old records  I have discussed plan of care as described above with RN , patient  on 01/17/2018   Albertine Grates MD, PhD  [facility] Pager [phone]. If 7PM-7AM, please contact night-coverage at [email or web address], password [facility] 01/17/2018, 10:07 AM  LOS: 2 days

## 2018-01-18 LAB — BASIC METABOLIC PANEL
ANION GAP: 11 (ref 5–15)
BUN: 11 mg/dL (ref 6–20)
CHLORIDE: 95 mmol/L — AB (ref 98–111)
CO2: 33 mmol/L — AB (ref 22–32)
CREATININE: 0.66 mg/dL (ref 0.44–1.00)
Calcium: 8.6 mg/dL — ABNORMAL LOW (ref 8.9–10.3)
GFR calc non Af Amer: >60 mL/min (ref >60)
Glucose, Bld: 245 mg/dL — ABNORMAL HIGH (ref 70–99)
Potassium: 3.4 mmol/L — ABNORMAL LOW (ref 3.5–5.1)
SODIUM: 139 mmol/L (ref 135–145)

## 2018-01-18 LAB — LEVETIRACETAM LEVEL
LEVETIRACETAM: 17 ug/mL (ref 10.0–40.0)
LEVETIRACETAM: NOT DETECTED ug/mL (ref 10.0–40.0)

## 2018-01-18 LAB — GLUCOSE, CAPILLARY
GLUCOSE-CAPILLARY: 198 mg/dL — AB (ref 70–99)
GLUCOSE-CAPILLARY: 125 mg/dL — AB (ref 70–99)
Glucose-Capillary: 211 mg/dL — ABNORMAL HIGH (ref 70–99)
Glucose-Capillary: 309 mg/dL — ABNORMAL HIGH (ref 70–99)

## 2018-01-18 LAB — MAGNESIUM: MAGNESIUM: 1.6 mg/dL — AB (ref 1.7–2.4)

## 2018-01-18 MED ORDER — LEVALBUTEROL HCL 1.25 MG/0.5ML IN NEBU: 1.2500 mg | INHALATION_SOLUTION | Freq: Four times a day (QID) | RESPIRATORY_TRACT | Status: DC | PRN

## 2018-01-18 MED ORDER — MAGNESIUM SULFATE 4 GM/100ML IV SOLN
4.0000 g | Freq: Once | INTRAVENOUS | Status: AC
  Administered 2018-01-18: 4 g via INTRAVENOUS
  Filled 2018-01-18: qty 100

## 2018-01-18 MED ORDER — MAGNESIUM CITRATE PO SOLN
1.0000 | Freq: Once | ORAL | Status: AC
  Administered 2018-01-18: 1 via ORAL
  Filled 2018-01-18: qty 296

## 2018-01-18 MED ORDER — FUROSEMIDE 10 MG/ML IJ SOLN
20.0000 mg | Freq: Once | INTRAMUSCULAR | Status: AC
  Administered 2018-01-18: 20 mg via INTRAVENOUS
  Filled 2018-01-18: qty 2

## 2018-01-18 MED ORDER — POTASSIUM CHLORIDE CRYS ER 20 MEQ PO TBCR
20.0000 meq | EXTENDED_RELEASE_TABLET | Freq: Once | ORAL | Status: AC
  Administered 2018-01-18: 20 meq via ORAL
  Filled 2018-01-18: qty 1

## 2018-01-18 MED ORDER — PREDNISONE 20 MG PO TABS
30.0000 mg | ORAL_TABLET | Freq: Every day | ORAL | Status: DC
  Administered 2018-01-19: 30 mg via ORAL
  Filled 2018-01-18: qty 1

## 2018-01-18 NOTE — Progress Notes (Signed)
Patient still refusing BIPAP. 

## 2018-01-18 NOTE — Progress Notes (Signed)
PROGRESS NOTE  Tresea Malleresa Haigler ZOX:[id number] DOB: 04-Apr-1961 DOA: 01/14/2018 PCP: Lonie Peakonroy, Nathan, PA-C  HPI/Recap of past 24 hours:   She was just finishing conversation with Financial assistance when I entered the room  She report No BM, less swollen ,breathing better, weight down but still above baseline Report chronic back, pain still cough , but not able to cough up  she remains oxygen dependent, she declined bipap last night due to claustrophobic    Assessment/Plan: Principal Problem:   Chest pain Active Problems:   COPD exacerbation (HCC)   Essential hypertension   Tobacco use disorder   CAD (coronary artery disease)   Hyperlipidemia   Anxiety   Depression   Seizure (HCC)   Stroke (HCC)   GERD (gastroesophageal reflux disease)   Chronic respiratory failure with hypoxia (HCC)   NSVT (nonsustained ventricular tachycardia) (HCC)   Sepsis (HCC)   Hypomagnesemia   Diabetes (HCC)  Acute hypoxic and hypercarbic respiratory failure/metabolic encephalopathy -Due to COPD exacerbation and on high dose sedation -CT chest no PE, chronic progressive scarring in lung and Peribronchial thickening suggesting chronic bronchitis.Patchy emphysematous changes in the lungs. -Respiratory viral panel negative -mrsa screening negative -Blood culture negative -procalcitonin less than 0.1 -improving, though still oxygen dependent -continue nebs, steroids -At baseline she is not o2 dependent and lives alone, still drives. -Wean oxygen, ambulate  Acute on chronic diastolic chf exacerbation: -she reports weight gain and being swollen, responding to lasix 40mg  iv x1 on 9/23 -improving, weight still above baseline, remain oxygen dependent -will give another dose lasix 20mg  x1 today  Brief hypotension (sbp dropped into 80's on presentation) Resolved, likely due to oversedation Respiratory viral panel negative mrsa screening negative Blood culture negative procalcitonin less than  0.1  Urine retention? -Per RN foley placed on 9/20 for urinary retention -She is awake and alert, mobilize,  -She urinated spontaneously after foley removal   Chest pain ( on presentation),  likely due to elevated blood pressure and COPD exacerbation Resolved Troponin negative, echo lvef wnl, no WMA. Does has grade II diastolic dysfunction  NSVT 2 episodes of 5-6beats on tele Cardiology consulted, think primary arrhythmia is less likely, cardiology think this is due to stress from copd exacerbation Start cardioselective betablocker bisoprolol She reports feeling palpitation last night, I have reviewed tele, she has been in sinus rhythm , continue tele for now  Hypomagnesemia:  Remain low, continue to replace Replace mag, repeat in am  HTN:  She was on metoprolol /lisinopril at home, this was held due to hypotension bp improving, start cardioselective betablocker bisoprolol, titrate up at tolerated Continue home meds imdur,  May need to start on lower dose lisinopril tomorrow, she is getting lasix today   H/o SEizure disorder -With h/o press syndrome /small SAH/seizures in 2017, she is started on keppra 500mg  bid since 2017 There is questionable seizure at home , she reports woke up with urinary incontinence and confusion, keppra dose increased to 759mh BID  Noninsulin dependent DM2 -Home oral meds held, on ssi here -a1c 9 -Blood glucose elevated while on steroids, taper steroids, adjust insulin  Tabacco use I have discussed tobacco cessation with the patient.    nicotine patch provided  Chronic back pain:  Has been on neurotin/celebrex/flexeril neurontin continued,  celebrex and flexeril held  Depression/anxiety/insomina: tsh wnl She reports has been on clonopin/seroquel Remus Loffler/ambien /wellbutrinfor over 10years These was held on presentation due to encephalopathy/minimal responsive Now fully awake, will restart low dose clonopin and seroquel, will continue hold  ambien I  have advised patient to slowly taper this meds D/c home meds wellbutrin due to h/o seizure Advise outpatient sleep study  Body mass index is 27.42 kg/m.   Code Status: full  Family Communication: patient   Disposition Plan:  She refused bipap qhs, wean oxygen, likely d/c home tomorrow, will need to check ambulating pulse ox, hopefully she can be weaned of o2 with lasix, otherwise, she may need home 02   Consultants:  Critical care  Procedures:  bipap  Antibiotics:  none   Objective: BP (!) 148/84 (BP Location: Right Wrist)   Pulse 83   Temp 98.2 F (36.8 C) (Oral)   Resp 16   Ht 5\' 2"  (1.575 m)   Wt 68 kg   SpO2 95%   BMI 27.42 kg/m   Intake/Output Summary (Last 24 hours) at 01/18/2018 1015 Last data filed at 01/18/2018 0900 Gross per 24 hour  Intake 1620 ml  Output 4550 ml  Net -2930 ml   Filed Weights   01/15/18 0133 01/17/18 0335 01/18/18 0429  Weight: 68 kg 69.5 kg 68 kg    Exam: Patient is examined daily including today on 01/18/2018, exams remain the same as of yesterday except that has changed    General:  NAD  Cardiovascular: nsr  Respiratory: improved aeration, no wheezing. No rales  Abdomen: Soft/ND/NT, positive BS  Musculoskeletal: No Edema  Neuro: alert, oriented   Data Reviewed: Basic Metabolic Panel: Recent Labs  Lab 01/14/18 2004 01/14/18 2228 01/15/18 0039 01/15/18 1213 01/16/18 0402 01/17/18 0400 01/18/18 0624  NA 137 136  --  138 139 140 139  K 3.8 4.1  --  5.0 4.7 3.7 3.4*  CL 87* 90*  --  104 103 98 95*  CO2 33*  --   --  23 27 31  33*  GLUCOSE 292* 233*  --  291* 197* 210* 245*  BUN 17 24*  --  13 6 10 11   CREATININE 0.66 0.50  --  0.52 0.44 0.64 0.66  CALCIUM 10.0  --   --  7.9* 8.0* 8.5* 8.6*  MG  --   --  1.5*  --   --  1.6* 1.6*  PHOS  --   --   --  2.9  --   --   --    Liver Function Tests: Recent Labs  Lab 01/15/18 1213 01/16/18 0402  AST  --  16  ALT  --  18  ALKPHOS  --  59  BILITOT  --  0.5    PROT  --  5.4*  ALBUMIN 2.8* 2.7*   No results for input(s): LIPASE, AMYLASE in the last 168 hours. No results for input(s): AMMONIA in the last 168 hours. CBC: Recent Labs  Lab 01/14/18 2004 01/14/18 2228 01/15/18 0039 01/16/18 0402 01/17/18 0400  WBC 12.5*  --  12.2* 11.8* 8.8  HGB 17.9* 18.7* 16.2* 14.0 14.1  HCT 56.6* 55.0* 52.0* 44.7 45.0  MCV 96.8  --  97.7 98.2 97.2  PLT 318  --  254 205 212   Cardiac Enzymes:   Recent Labs  Lab 01/15/18 0039 01/15/18 0701 01/15/18 1213  TROPONINI <0.03 <0.03 <0.03   BNP (last 3 results) Recent Labs    01/14/18 2051  BNP 18.6    ProBNP (last 3 results) No results for input(s): PROBNP in the last 8760 hours.  CBG: Recent Labs  Lab 01/17/18 1612 01/17/18 1823 01/17/18 1939 01/17/18 2132 01/18/18 0740  GLUCAP 438* 379* 218*  160* 198*    Recent Results (from the past 240 hour(s))  Blood culture (routine x 2)     Status: None (Preliminary result)   Collection Time: 01/14/18 11:25 PM  Result Value Ref Range Status   Specimen Description BLOOD LEFT ANTECUBITAL  Final   Special Requests   Final    BOTTLES DRAWN AEROBIC AND ANAEROBIC Blood Culture adequate volume   Culture   Final    NO GROWTH 4 DAYS Performed at [facility], [address], [place], [place] [address]    Report Status PENDING  Incomplete  Blood culture (routine x 2)     Status: None (Preliminary result)   Collection Time: 01/14/18 11:33 PM  Result Value Ref Range Status   Specimen Description BLOOD RIGHT ANTECUBITAL  Final   Special Requests   Final    BOTTLES DRAWN AEROBIC AND ANAEROBIC Blood Culture adequate volume   Culture   Final    NO GROWTH 4 DAYS Performed at [facility], [address] [place], [place] [address]    Report Status PENDING  Incomplete  Respiratory Panel by PCR     Status: None   Collection Time: 01/15/18  9:53 AM  Result Value Ref Range Status   Adenovirus NOT DETECTED NOT DETECTED Final   Coronavirus  229E NOT DETECTED NOT DETECTED Final   Coronavirus HKU1 NOT DETECTED NOT DETECTED Final   Coronavirus NL63 NOT DETECTED NOT DETECTED Final   Coronavirus OC43 NOT DETECTED NOT DETECTED Final   Metapneumovirus NOT DETECTED NOT DETECTED Final   Rhinovirus / Enterovirus NOT DETECTED NOT DETECTED Final   Influenza A NOT DETECTED NOT DETECTED Final   Influenza B NOT DETECTED NOT DETECTED Final   Parainfluenza Virus 1 NOT DETECTED NOT DETECTED Final   Parainfluenza Virus 2 NOT DETECTED NOT DETECTED Final   Parainfluenza Virus 3 NOT DETECTED NOT DETECTED Final   Parainfluenza Virus 4 NOT DETECTED NOT DETECTED Final   Respiratory Syncytial Virus NOT DETECTED NOT DETECTED Final   Bordetella pertussis NOT DETECTED NOT DETECTED Final   Chlamydophila pneumoniae NOT DETECTED NOT DETECTED Final   Mycoplasma pneumoniae NOT DETECTED NOT DETECTED Final  MRSA PCR Screening     Status: None   Collection Time: 01/15/18  9:53 AM  Result Value Ref Range Status   MRSA by PCR NEGATIVE NEGATIVE Final    Comment:        The GeneXpert MRSA Assay (FDA approved for NASAL specimens only), is one component of a comprehensive MRSA colonization surveillance program. It is not intended to diagnose MRSA infection nor to guide or monitor treatment for MRSA infections. Performed at [facility], [address] [place], [place] [address]      Studies: No results found.  Scheduled Meds: . aspirin  81 mg Oral Daily  . bisoprolol  5 mg Oral Daily  . chlorhexidine  15 mL Mouth Rinse BID  . clonazePAM  0.5 mg Oral BID  . enoxaparin (LOVENOX) injection  40 mg Subcutaneous Q24H  . furosemide  20 mg Intravenous Once  . gabapentin  600 mg Oral BID  . guaiFENesin  600 mg Oral BID  . insulin aspart  0-20 Units Subcutaneous TID WC  . insulin aspart  0-5 Units Subcutaneous QHS  . insulin aspart  4 Units Subcutaneous TID WC  . ipratropium  0.5 mg Nebulization TID  . isosorbide mononitrate  15 mg Oral Daily  .  levalbuterol  1.25 mg Nebulization TID  .  levETIRAcetam  750 mg Oral BID  . magnesium citrate  1 Bottle Oral Once  . magnesium citrate  1 Bottle Oral Once  . mouth rinse  15 mL Mouth Rinse q12n4p  . Melatonin  3 mg Oral QHS  . nicotine  14 mg Transdermal Daily  . pantoprazole  40 mg Oral BID AC  . polyethylene glycol  17 g Oral Daily  . potassium chloride  20 mEq Oral Once  . [START ON 01/19/2018] predniSONE  30 mg Oral Q breakfast  . QUEtiapine  25 mg Oral QHS  . senna-docusate  1 tablet Oral BID  . sodium chloride flush  3 mL Intravenous Q12H    Continuous Infusions: . magnesium sulfate 1 - 4 g bolus IVPB    . sodium chloride       Time spent: I have personally reviewed and interpreted on  01/18/2018 daily labs, tele strips, imagings as discussed above under date review session and assessment and plans.  I reviewed all nursing notes, pharmacy notes, consultant notes,  vitals, pertinent old records  I have discussed plan of care as described above with RN , patient  on 01/18/2018   Albertine Grates MD, PhD  [facility] Pager [phone]. If 7PM-7AM, please contact night-coverage at [email or web address], password [facility] 01/18/2018, 10:15 AM  LOS: 3 days

## 2018-01-18 NOTE — Progress Notes (Signed)
Pt declined use of BIPAP. 

## 2018-01-19 DIAGNOSIS — G9341 Metabolic encephalopathy: Secondary | ICD-10-CM

## 2018-01-19 DIAGNOSIS — R079 Chest pain, unspecified: Secondary | ICD-10-CM

## 2018-01-19 DIAGNOSIS — J9601 Acute respiratory failure with hypoxia: Secondary | ICD-10-CM

## 2018-01-19 DIAGNOSIS — E119 Type 2 diabetes mellitus without complications: Secondary | ICD-10-CM

## 2018-01-19 DIAGNOSIS — F419 Anxiety disorder, unspecified: Secondary | ICD-10-CM

## 2018-01-19 DIAGNOSIS — I472 Ventricular tachycardia: Secondary | ICD-10-CM

## 2018-01-19 DIAGNOSIS — J9602 Acute respiratory failure with hypercapnia: Secondary | ICD-10-CM

## 2018-01-19 DIAGNOSIS — R569 Unspecified convulsions: Secondary | ICD-10-CM

## 2018-01-19 LAB — BASIC METABOLIC PANEL
Anion gap: 10 (ref 5–15)
BUN: 14 mg/dL (ref 6–20)
CHLORIDE: 95 mmol/L — AB (ref 98–111)
CO2: 33 mmol/L — AB (ref 22–32)
Calcium: 8.7 mg/dL — ABNORMAL LOW (ref 8.9–10.3)
Creatinine, Ser: 0.6 mg/dL (ref 0.44–1.00)
GFR calc Af Amer: >60 mL/min (ref >60)
GFR calc non Af Amer: >60 mL/min (ref >60)
Glucose, Bld: 227 mg/dL — ABNORMAL HIGH (ref 70–99)
POTASSIUM: 4.2 mmol/L (ref 3.5–5.1)
Sodium: 138 mmol/L (ref 135–145)

## 2018-01-19 LAB — GLUCOSE, CAPILLARY
GLUCOSE-CAPILLARY: 198 mg/dL — AB (ref 70–99)
GLUCOSE-CAPILLARY: 282 mg/dL — AB (ref 70–99)
Glucose-Capillary: 194 mg/dL — ABNORMAL HIGH (ref 70–99)

## 2018-01-19 LAB — CULTURE, BLOOD (ROUTINE X 2)
CULTURE: NO GROWTH
Culture: NO GROWTH
SPECIAL REQUESTS: ADEQUATE
SPECIAL REQUESTS: ADEQUATE

## 2018-01-19 LAB — MAGNESIUM: Magnesium: 2.1 mg/dL (ref 1.7–2.4)

## 2018-01-19 MED ORDER — MAGNESIUM GLUCONATE 30 MG PO TABS: 30.0000 mg | ORAL_TABLET | Freq: Every day | ORAL | 0 refills | Status: AC

## 2018-01-19 MED ORDER — POTASSIUM CHLORIDE ER 20 MEQ PO TBCR: 20.0000 meq | EXTENDED_RELEASE_TABLET | ORAL | 0 refills | Status: AC

## 2018-01-19 MED ORDER — MELATONIN 3 MG PO TABS: 3.0000 mg | ORAL_TABLET | Freq: Every day | ORAL | 0 refills | Status: AC

## 2018-01-19 MED ORDER — CLONAZEPAM 1 MG PO TABS: 0.5000 mg | ORAL_TABLET | Freq: Two times a day (BID) | ORAL | 0 refills | Status: AC

## 2018-01-19 MED ORDER — GUAIFENESIN ER 600 MG PO TB12: 600.0000 mg | ORAL_TABLET | Freq: Two times a day (BID) | ORAL | 0 refills | Status: AC

## 2018-01-19 MED ORDER — PREDNISONE 10 MG PO TABS: ORAL_TABLET | ORAL | 0 refills | Status: AC

## 2018-01-19 MED ORDER — POLYETHYLENE GLYCOL 3350 17 G PO PACK: 17.0000 g | PACK | Freq: Every day | ORAL | 0 refills | Status: AC

## 2018-01-19 MED ORDER — LEVETIRACETAM 750 MG PO TABS: 750.0000 mg | ORAL_TABLET | Freq: Two times a day (BID) | ORAL | 0 refills | Status: AC

## 2018-01-19 MED ORDER — FUROSEMIDE 20 MG PO TABS: 20.0000 mg | ORAL_TABLET | Freq: Every day | ORAL | 0 refills | Status: AC | PRN

## 2018-01-19 MED ORDER — LISINOPRIL 2.5 MG PO TABS: 2.5000 mg | ORAL_TABLET | Freq: Every day | ORAL | 0 refills | Status: AC

## 2018-01-19 MED ORDER — SENNOSIDES-DOCUSATE SODIUM 8.6-50 MG PO TABS: 1.0000 | ORAL_TABLET | Freq: Every day | ORAL | 0 refills | Status: AC

## 2018-01-19 MED ORDER — QUETIAPINE FUMARATE 100 MG PO TABS: 50.0000 mg | ORAL_TABLET | Freq: Every day | ORAL | 0 refills | Status: AC

## 2018-01-19 MED ORDER — ACETAMINOPHEN 325 MG PO TABS: 650.0000 mg | ORAL_TABLET | ORAL | 0 refills | Status: AC | PRN

## 2018-01-19 MED ORDER — BISOPROLOL FUMARATE 5 MG PO TABS: 5.0000 mg | ORAL_TABLET | Freq: Every day | ORAL | 0 refills | Status: AC

## 2018-01-19 NOTE — Progress Notes (Signed)
SATURATION QUALIFICATIONS: (This note is used to comply with regulatory documentation for home oxygen)  Patient Saturations on Room Air at Rest = 94  Patient Saturations on Room Air while Ambulating = 93  Patient Saturations on 0 Liters of oxygen while Ambulating = 93  Please briefly explain why patient needs home oxygen: pt does not qualify for home O2

## 2018-01-19 NOTE — Discharge Summary (Signed)
Discharge Summary  Joan Newman UEA:[id number] DOB: 01-15-1961  PCP: Lonie Peak, PA-C  Admit date: 01/14/2018 Discharge date: 01/19/2018  Time spent: , more than 50% time spent on coordination of care.  Recommendations for Outpatient Follow-up:  1. F/u with PMD within a week  for hospital discharge follow up, repeat cbc/bmp at follow up. pcp to refer to outpatient sleep study and PFT 2. F/u with neurology for h/o seizures  Discharge Diagnoses:  Active Hospital Problems   Diagnosis Date Noted  . Chest pain 11/25/2015  . Chronic respiratory failure with hypoxia (HCC) 01/15/2018  . NSVT (nonsustained ventricular tachycardia) (HCC) 01/15/2018  . Sepsis (HCC) 01/15/2018  . Hypomagnesemia 01/15/2018  . Diabetes (HCC) 01/15/2018  . Stroke (HCC) 01/14/2018  . GERD (gastroesophageal reflux disease) 01/14/2018  . CAD (coronary artery disease) 11/23/2015  . Depression 11/23/2015  . Anxiety 11/23/2015  . Hyperlipidemia 11/23/2015  . Seizure (HCC) 11/23/2015  . Tobacco use disorder 11/23/2015  . COPD exacerbation (HCC)   . Essential hypertension     Resolved Hospital Problems  No resolved problems to display.    Discharge Condition: stable  Diet recommendation: heart healthy/carb modified  Filed Weights   01/17/18 0335 01/18/18 0429 01/19/18 0445  Weight: 69.5 kg 68 kg 66.8 kg    History of present illness: (per admitting MD Dr Clyde Lundborg) PCP: Lonie Peak, PA-C   Patient coming from:  The patient is coming from home.  At baseline, pt is independent for most of ADL.   Chief Complaint: chest pain and possible seizure  HPI: Joan Newman is a 57 y.o. female with medical history significant of COPD, stroke, GERD, depression with anxiety, CAD, seizure, S AH, tobacco abuse, who presents with chest pain and a possible seizure.  Patient states that he has been having some chest discomfort for more than 10 days. Since yesterday her chest pain becomes more constant. It  is located in substernal area, severe, constant, pressure-like, radiating to the left jaw, left shoulder.  She was seen in [facility] ED twice for evaluation since September 14. Both times, she had ruled out for MI, and was treated with steroids for component of COPD exacerbation. Patient states that he always has some cough and shortness of breath due to COPD, which is slightly worse than baseline.  She has clear mucus production, but no fever or chills. Patient has nausea, but denies vomiting, diarrhea or abdominal pain.  No symptoms of UTI. Pt states that she may have had episode of seizure at about 1:00 PM. She had confusion and lost urinary continence during the episode. She denies unilateral weakness, numbness or tingling his extremities.  No facial droop or slurred speech. Per report, pt was noted to have 2 brief runs of NSVT (5-6 beats each) on the monitor in ED.   ED Course: pt was found to have WBC 12.5, lactic acid of 2.45, BNP 18.6, electrolytes renal function okay, tachycardia with heart rate up to 136, tachypnea, oxygen saturation 89% on room air, temperature normal, electrolytes renal function okay.  CT head is negative for acute intracranial abnormalities.  Patient is placed on telemetry bed for observation.  Cardiology Dr. Allena Katz was consulted.  CT angiogram chest  1. Negative for PE 2. Scarring in the lung bases is progressing since previous study. small focal area of infiltration in the anterior left lingula may indicate a focal area of pneumonia or developing scarring. 3. Patchy emphysematous changes in the lungs.  4. Superior endplate compression deformities at T11 and  T12 with Schmorl's nodes, progressed since previous study but with nonacute appearance   Hospital Course:  Principal Problem:   Chest pain Active Problems:   COPD exacerbation (HCC)   Essential hypertension   Tobacco use disorder   CAD (coronary artery disease)   Hyperlipidemia   Anxiety    Depression   Seizure (HCC)   Stroke (HCC)   GERD (gastroesophageal reflux disease)   Chronic respiratory failure with hypoxia (HCC)   NSVT (nonsustained ventricular tachycardia) (HCC)   Sepsis (HCC)   Hypomagnesemia   Diabetes (HCC)   Acute hypoxic and hypercarbic respiratory failure/metabolic encephalopathy -Due to COPD exacerbation and on high dose sedation -CT chest no PE, chronic progressive scarring in lung and Peribronchial thickening suggesting chronic bronchitis.Patchy emphysematous changes in the lungs. -Respiratory viral panel negative -mrsa screening negative -Blood culture negative -procalcitonin less than 0.1 -she is weaned of oxygen at discharge  Acute on chronic diastolic chf exacerbation: -she reports weight gain and being swollen, responding to lasix 40mg  iv x1 on 9/22 -another dose lasix 20mg  x1 on 9/23 -she is euvolemic at discharge, she is weaned off oxygen at discharge. -she is discharged on prn lasix for weight gain and edema   Urine retention? -Per RN foley placed on 9/20 for urinary retention -She is awake and alert, mobilize,  -She urinated spontaneously after foley removal   Chest pain ( on presentation),  likely due to elevated blood pressure and COPD exacerbation Resolved Troponin negative, echo lvef wnl, no WMA. Does has grade II diastolic dysfunction  NSVT 2 episodes of 5-6beats on tele initially on presentation Cardiology consulted, think primary arrhythmia is less likely, cardiology think this is due to stress from copd exacerbation Start cardioselective betablocker bisoprolol She reports feeling palpitation last night, I have reviewed tele, she has been in sinus rhythm   Hypomagnesemia:  Replaced and normalized,  She is discharged on oral mag supplement at discharge  HTN:  She was on metoprolol /lisinopril /imdure at home, these were held due to hypotension on presentation  (sbp dropped into 80's on presentation) -thought  initial  hypotension was due to oversedation, no infection  -bp improving, start cardioselective betablocker bisoprolol,  -start on lower dose lisinopril  -Start prn lasix -She is instructed to monitor bp at home, pcp to continue adjust bp meds   H/o SEizure disorder -With h/o press syndrome /small SAH/seizures in 2017, she is started on keppra 500mg  bid since 2017 There is questionable seizure at home , she reports woke up with urinary incontinence and confusion, keppra dose increased to BID -she is to follow up with neurology  Noninsulin dependent DM2 -Home oral meds held, on ssi here -a1c 9 -Blood glucose elevated while on steroids, taper steroids, adjust insulin -home oral meds resume at discharge -she is to follow with pcp for further management -she did have lactic acidosis on presentation which has resolved.  - pcp to repeat lactic acid at hospital follow up, consider discontinue metformin if lactic acid elevated at follow up.  Tabacco use I have discussed tobacco cessation with the patient.   nicotine patch provided I have discussed tobacco cessation with the patient.  I have counseled the patient regarding the negative impacts of continued tobacco use including but not limited to lung cancer, COPD, and cardiovascular disease.  I have discussed alternatives to tobacco and modalities that may help facilitate tobacco cessation including but not limited to biofeedback, hypnosis, and medications.  Total time spent with tobacco counseling was 4 minutes.  Chronic back pain:  Has been on neurotin/celebrex/flexeril neurontin continued, flexeril held initially due to encephalopathy, flexeril resume on prn basis at discharge celebrex discontinued, patient reports it upset her stomch  Depression/anxiety/insomina: tsh wnl She reports has been on clonopin/seroquel Remus Loffler /wellbutrinfor over 10years These was held on presentation due to encephalopathy/minimal  responsive Now fully awake, will restart low dose clonopin and seroquel, will continue hold ambien I have advised patient to slowly taper this meds D/c home meds wellbutrin due to h/o seizure Advise outpatient sleep study Advise to continue to work with pmd to taper sedative meds  Body mass index is 27.42 kg/m.   Code Status: full  Family Communication: patient   Disposition Plan:  home   Consultants:  Critical care  Procedures:  bipap  Antibiotics:  none   Discharge Exam: BP 111/87 (BP Location: Right Arm)   Pulse 71   Temp 98.1 F (36.7 C) (Oral)   Resp 18   Ht 5\' 2"  (1.575 m)   Wt 66.8 kg   SpO2 100%   BMI 26.94 kg/m    General:  NAD  Cardiovascular: nsr  Respiratory: improved aeration, no wheezing. No rales  Abdomen: Soft/ND/NT, positive BS  Musculoskeletal: No Edema  Neuro: alert, oriented    Discharge Instructions You were cared for by a hospitalist during your hospital stay. If you have any questions about your discharge medications or the care you received while you were in the hospital after you are discharged, you can call the unit and asked to speak with the hospitalist on call if the hospitalist that took care of you is not available. Once you are discharged, your primary care physician will handle any further medical issues. Please note that NO REFILLS for any discharge medications will be authorized once you are discharged, as it is imperative that you return to your primary care physician (or establish a relationship with a primary care physician if you do not have one) for your aftercare needs so that they can reassess your need for medications and monitor your lab values.  Discharge Instructions    Diet - low sodium heart healthy   Complete by:  As directed    Carb modified   Increase activity slowly   Complete by:  As directed      Allergies as of 01/19/2018   No Known Allergies     Medication List    STOP taking  these medications   buPROPion 150 MG 12 hr tablet Commonly known as:  WELLBUTRIN SR   celecoxib 100 MG capsule Commonly known as:  CELEBREX   docusate sodium 100 MG capsule Commonly known as:  COLACE   HYDROcodone-acetaminophen 5-325 MG tablet Commonly known as:  NORCO/VICODIN   isosorbide mononitrate 30 MG 24 hr tablet Commonly known as:  IMDUR   metoprolol tartrate 25 MG tablet Commonly known as:  LOPRESSOR   traMADol 50 MG tablet Commonly known as:  ULTRAM   zolpidem 10 MG tablet Commonly known as:  AMBIEN     TAKE these medications   acetaminophen 325 MG tablet Commonly known as:  TYLENOL Take 2 tablets (650 mg total) by mouth every 4 (four) hours as needed for headache or mild pain. What changed:    medication strength  how much to take  when to take this  reasons to take this   albuterol 108 (90 Base) MCG/ACT inhaler Commonly known as:  PROVENTIL HFA;VENTOLIN HFA Inhale 2 puffs into the lungs every 4 (four) hours as needed  for wheezing or shortness of breath.   aspirin 81 MG chewable tablet Chew 81 mg by mouth daily.   atorvastatin 80 MG tablet Commonly known as:  LIPITOR Take 1 tablet (80 mg total) by mouth every evening.   bisoprolol 5 MG tablet Commonly known as:  ZEBETA Take 1 tablet (5 mg total) by mouth daily. Start taking on:  01/20/2018   clonazePAM 1 MG tablet Commonly known as:  KLONOPIN Take 0.5 tablets (0.5 mg total) by mouth 2 (two) times daily. What changed:  how much to take   cyclobenzaprine 5 MG tablet Commonly known as:  FLEXERIL Take 1 tablet (5 mg total) by mouth 3 (three) times daily as needed for muscle spasms.   Fluticasone-Salmeterol 250-50 MCG/DOSE Aepb Commonly known as:  ADVAIR Inhale 2 puffs into the lungs 2 (two) times daily.   furosemide 20 MG tablet Commonly known as:  LASIX Take 1 tablet (20 mg total) by mouth daily as needed for fluid or edema.   gabapentin 600 MG tablet Commonly known as:  NEURONTIN Take  600 mg by mouth 2 (two) times daily.   GLIPIZIDE PO Take 1 tablet by mouth daily before breakfast.   guaiFENesin 600 MG 12 hr tablet Commonly known as:  MUCINEX Take 1 tablet (600 mg total) by mouth 2 (two) times daily.   levETIRAcetam 750 MG tablet Commonly known as:  KEPPRA Take 1 tablet (750 mg total) by mouth 2 (two) times daily. What changed:    medication strength  how much to take   lisinopril 2.5 MG tablet Commonly known as:  PRINIVIL,ZESTRIL Take 1 tablet (2.5 mg total) by mouth daily. What changed:    medication strength  how much to take  when to take this   magnesium gluconate 30 MG tablet Commonly known as:  MAGONATE Take 1 tablet (30 mg total) by mouth daily.   Melatonin 3 MG Tabs Take 1 tablet (3 mg total) by mouth at bedtime.   metFORMIN 500 MG tablet Commonly known as:  GLUCOPHAGE Take 500 mg by mouth 2 (two) times daily.   nitroGLYCERIN 0.4 MG SL tablet Commonly known as:  NITROSTAT Place 1 tablet (0.4 mg total) under the tongue every 5 (five) minutes as needed for chest pain.   pantoprazole 40 MG tablet Commonly known as:  PROTONIX Take 1 tablet (40 mg total) by mouth 2 (two) times daily before a meal.   polyethylene glycol packet Commonly known as:  MIRALAX / GLYCOLAX Take 17 g by mouth daily. What changed:    when to take this  reasons to take this   Potassium Chloride ER 20 MEQ Tbcr Take 20 mEq by mouth every Monday, Wednesday, and Friday. Start taking on:  01/20/2018 What changed:    how much to take  when to take this   predniSONE 10 MG tablet Commonly known as:  DELTASONE Take 20mg  on day one, then 10mg  on day two, then stop.   PRESCRIPTION MEDICATION Prednisone taper pack (strength not verifiable): Take as directed   QUEtiapine 100 MG tablet Commonly known as:  SEROQUEL Take 0.5 tablets (50 mg total) by mouth at bedtime. What changed:  how much to take   senna-docusate 8.6-50 MG tablet Commonly known as:   Senokot-S Take 1 tablet by mouth at bedtime.   tiotropium 18 MCG inhalation capsule Commonly known as:  SPIRIVA Place 18 mcg into inhaler and inhale daily as needed (for flares).   tobramycin 0.3 % ophthalmic ointment Commonly known as:  TOBREX Place into both eyes 3 (three) times daily.      No Known Allergies Follow-up Information    [facility] Follow up on 02/03/2018.   Why:  11:10 for hospital follow up Contact information: [address] [facility] [address] [phone]       please have your pcp to refer you to have sleep study and lung function test Follow up.        Lonie Peak, PA-C Follow up in 1 week(s).   Specialty:  Physician Assistant Why:  hospital discharge follow up repeat cbc/bmp/lactic acid, consider stop metformin if lactic acid remain elevated Contact information: [address] [place] [address] [phone]        [facility] Follow up in 1 month(s).   Why:  for h/o seizure on keppra Contact information: [address]     [address] [place] [address] [phone]           The results of significant diagnostics from this hospitalization (including imaging, microbiology, ancillary and laboratory) are listed below for reference.    Significant Diagnostic Studies: Ct Head Wo Contrast  Result Date: 01/14/2018 CLINICAL DATA:  Chest pain radiating to the jaw and back. Altered level of consciousness. History of seizures. EXAM: CT HEAD WITHOUT CONTRAST TECHNIQUE: Contiguous axial images were obtained from the base of the skull through the vertex without intravenous contrast. COMPARISON:  04/08/2017 FINDINGS: Brain: No evidence of acute infarction, hemorrhage, hydrocephalus, extra-axial collection or mass lesion/mass effect. Vascular: Mild intracranial arterial vascular calcifications are present. Skull: Calvarium appears intact. Sinuses/Orbits:  Paranasal sinuses and mastoid air cells are clear. Other: None. IMPRESSION: No acute intracranial abnormality. Electronically Signed   By: Burman Nieves M.D.   On: 01/14/2018 22:55   Ct Angio Chest Pe W And/or Wo Contrast  Result Date: 01/14/2018 CLINICAL DATA:  Chest pain radiating to the jaw on back. Previous history of myocardial infarct with similar symptoms. EXAM: CT ANGIOGRAPHY CHEST WITH CONTRAST TECHNIQUE: Multidetector CT imaging of the chest was performed using the standard protocol during bolus administration of intravenous contrast. Multiplanar CT image reconstructions and MIPs were obtained to evaluate the vascular anatomy. CONTRAST:  59 mL Isovue 370 COMPARISON:  12/09/2015 FINDINGS: Cardiovascular: There is good opacification of the central and segmental pulmonary arteries. No focal filling defects. No evidence of significant pulmonary embolus. Normal caliber thoracic aorta with minimal scattered calcifications. No aortic dissection. Normal heart size. No pericardial effusions. Mediastinum/Nodes: Esophagus is decompressed. No significant lymphadenopathy in the chest. Thyroid gland is unremarkable. Lungs/Pleura: Scarring in the lung bases is progressing since previous study. Small focal area of infiltration demonstrated in the anterior left lingula. This may indicate a focal area of pneumonia or developing scarring. Patchy emphysematous changes in the lungs. Peribronchial thickening suggesting chronic bronchitis. No pleural effusions. No pneumothorax. Airways are patent. Upper Abdomen: No acute abnormalities identified. Musculoskeletal: Superior endplate compression deformities at T11 and T12 with Schmorl's nodes. Appearance is chronic with degenerative changes present. There is progression since the previous study at T12. Review of the MIP images confirms the above findings. IMPRESSION: 1. No evidence of significant pulmonary embolus. 2. Scarring in the lung bases is progressing since previous  study. Small focal area of infiltration in the anterior left lingula may indicate a focal area of pneumonia or developing scarring. 3. Patchy emphysematous changes in the lungs. 4. Superior endplate compression deformities at T11 and T12 with Schmorl's nodes, progressed since previous study  but with nonacute appearance. Aortic Atherosclerosis (ICD10-I70.0) and Emphysema (ICD10-J43.9). Electronically Signed   By: Burman Nieves M.D.   On: 01/14/2018 23:03   Dg Chest Port 1 View  Result Date: 01/14/2018 CLINICAL DATA:  Chest pain radiating to jaw and back. Similar symptoms with prior myocardial infarction. Smoker. EXAM: PORTABLE CHEST 1 VIEW COMPARISON:  Chest radiograph April 08, 2017 FINDINGS: Cardiomediastinal silhouette is normal. Mild calcific atherosclerosis aortic arch. LEFT upper lung zone granuloma. No pleural effusions or focal consolidations. Chronic interstitial changes. Bibasilar strandy densities. Trachea projects midline and there is no pneumothorax. Soft tissue planes and included osseous structures are non-suspicious. IMPRESSION: 1. Minimal bibasilar atelectasis/scarring. 2.  Aortic Atherosclerosis (ICD10-I70.0). 3.  Emphysema (ICD10-J43.9). Electronically Signed   By: Awilda Metro M.D.   On: 01/14/2018 21:00    Microbiology: Recent Results (from the past 240 hour(s))  Blood culture (routine x 2)     Status: None   Collection Time: 01/14/18 11:25 PM  Result Value Ref Range Status   Specimen Description BLOOD LEFT ANTECUBITAL  Final   Special Requests   Final    BOTTLES DRAWN AEROBIC AND ANAEROBIC Blood Culture adequate volume   Culture   Final    NO GROWTH 5 DAYS Performed at [facility], [address], [place], [place] [address]    Report Status 01/19/2018 FINAL  Final  Blood culture (routine x 2)     Status: None   Collection Time: 01/14/18 11:33 PM  Result Value Ref Range Status   Specimen Description BLOOD RIGHT ANTECUBITAL  Final   Special Requests    Final    BOTTLES DRAWN AEROBIC AND ANAEROBIC Blood Culture adequate volume   Culture   Final    NO GROWTH 5 DAYS Performed at [facility], [address] [place], [place] [address]    Report Status 01/19/2018 FINAL  Final  Respiratory Panel by PCR     Status: None   Collection Time: 01/15/18  9:53 AM  Result Value Ref Range Status   Adenovirus NOT DETECTED NOT DETECTED Final   Coronavirus 229E NOT DETECTED NOT DETECTED Final   Coronavirus HKU1 NOT DETECTED NOT DETECTED Final   Coronavirus NL63 NOT DETECTED NOT DETECTED Final   Coronavirus OC43 NOT DETECTED NOT DETECTED Final   Metapneumovirus NOT DETECTED NOT DETECTED Final   Rhinovirus / Enterovirus NOT DETECTED NOT DETECTED Final   Influenza A NOT DETECTED NOT DETECTED Final   Influenza B NOT DETECTED NOT DETECTED Final   Parainfluenza Virus 1 NOT DETECTED NOT DETECTED Final   Parainfluenza Virus 2 NOT DETECTED NOT DETECTED Final   Parainfluenza Virus 3 NOT DETECTED NOT DETECTED Final   Parainfluenza Virus 4 NOT DETECTED NOT DETECTED Final   Respiratory Syncytial Virus NOT DETECTED NOT DETECTED Final   Bordetella pertussis NOT DETECTED NOT DETECTED Final   Chlamydophila pneumoniae NOT DETECTED NOT DETECTED Final   Mycoplasma pneumoniae NOT DETECTED NOT DETECTED Final  MRSA PCR Screening     Status: None   Collection Time: 01/15/18  9:53 AM  Result Value Ref Range Status   MRSA by PCR NEGATIVE NEGATIVE Final    Comment:        The GeneXpert MRSA Assay (FDA approved for NASAL specimens only), is one component of a comprehensive MRSA colonization surveillance program. It is not intended to diagnose MRSA infection nor to guide or monitor treatment for MRSA infections. Performed at [facility], [address] [place], [place] [address]  Labs: Basic Metabolic Panel: Recent Labs  Lab 01/15/18 0039 01/15/18 1213 01/16/18 0402 01/17/18 0400 01/18/18 0624 01/19/18 0523  NA  --  138 139 140 139 138   K  --  5.0 4.7 3.7 3.4* 4.2  CL  --  104 103 98 95* 95*  CO2  --  23 27 31  33* 33*  GLUCOSE  --  291* 197* 210* 245* 227*  BUN  --  13 6 10 11 14   CREATININE  --  0.52 0.44 0.64 0.66 0.60  CALCIUM  --  7.9* 8.0* 8.5* 8.6* 8.7*  MG 1.5*  --   --  1.6* 1.6* 2.1  PHOS  --  2.9  --   --   --   --    Liver Function Tests: Recent Labs  Lab 01/15/18 1213 01/16/18 0402  AST  --  16  ALT  --  18  ALKPHOS  --  59  BILITOT  --  0.5  PROT  --  5.4*  ALBUMIN 2.8* 2.7*   No results for input(s): LIPASE, AMYLASE in the last 168 hours. No results for input(s): AMMONIA in the last 168 hours. CBC: Recent Labs  Lab 01/14/18 2004 01/14/18 2228 01/15/18 0039 01/16/18 0402 01/17/18 0400  WBC 12.5*  --  12.2* 11.8* 8.8  HGB 17.9* 18.7* 16.2* 14.0 14.1  HCT 56.6* 55.0* 52.0* 44.7 45.0  MCV 96.8  --  97.7 98.2 97.2  PLT 318  --  254 205 212   Cardiac Enzymes: Recent Labs  Lab 01/15/18 0039 01/15/18 0701 01/15/18 1213  TROPONINI <0.03 <0.03 <0.03   BNP: BNP (last 3 results) Recent Labs    01/14/18 2051  BNP 18.6    ProBNP (last 3 results) No results for input(s): PROBNP in the last 8760 hours.  CBG: Recent Labs  Lab 01/18/18 1113 01/18/18 1631 01/18/18 2122 01/19/18 0741 01/19/18 1058  GLUCAP 211* 309* 125* 194* 198*       Signed:  Albertine GratesFang Derrious Bologna MD, PhD  [facility] 01/19/2018, 12:01 PM

## 2018-02-03 ENCOUNTER — Inpatient Hospital Stay: Payer: Self-pay | Admitting: Family Medicine

## 2018-10-27 DEATH — deceased
